# Patient Record
Sex: Male | Born: 1935 | Race: White | Hispanic: No | Marital: Married | Smoking: Never smoker
Health system: Southern US, Community
[De-identification: ages and names within clinical notes are randomized; demographics above are authoritative.]

## PROBLEM LIST (undated history)

## (undated) DIAGNOSIS — I10 Essential (primary) hypertension: Secondary | ICD-10-CM

## (undated) DIAGNOSIS — R430 Anosmia: Secondary | ICD-10-CM

## (undated) DIAGNOSIS — N189 Chronic kidney disease, unspecified: Secondary | ICD-10-CM

## (undated) DIAGNOSIS — N2889 Other specified disorders of kidney and ureter: Secondary | ICD-10-CM

## (undated) DIAGNOSIS — H3530 Unspecified macular degeneration: Secondary | ICD-10-CM

## (undated) DIAGNOSIS — R809 Proteinuria, unspecified: Secondary | ICD-10-CM

## (undated) DIAGNOSIS — L309 Dermatitis, unspecified: Secondary | ICD-10-CM

## (undated) DIAGNOSIS — H9190 Unspecified hearing loss, unspecified ear: Secondary | ICD-10-CM

## (undated) DIAGNOSIS — J449 Chronic obstructive pulmonary disease, unspecified: Secondary | ICD-10-CM

## (undated) HISTORY — PX: MIDDLE EAR SURGERY: SHX713

## (undated) HISTORY — PX: HERNIA REPAIR: SHX51

---

## 2004-12-11 ENCOUNTER — Inpatient Hospital Stay (HOSPITAL_COMMUNITY): Admission: EM | Admit: 2004-12-11 | Discharge: 2004-12-12 | Payer: Self-pay | Admitting: Emergency Medicine

## 2005-02-26 ENCOUNTER — Ambulatory Visit (HOSPITAL_COMMUNITY): Admission: RE | Admit: 2005-02-26 | Discharge: 2005-02-27 | Payer: Self-pay | Admitting: Otolaryngology

## 2005-02-26 ENCOUNTER — Encounter (INDEPENDENT_AMBULATORY_CARE_PROVIDER_SITE_OTHER): Payer: Self-pay | Admitting: Specialist

## 2005-10-02 ENCOUNTER — Ambulatory Visit (HOSPITAL_BASED_OUTPATIENT_CLINIC_OR_DEPARTMENT_OTHER): Admission: RE | Admit: 2005-10-02 | Discharge: 2005-10-02 | Payer: Self-pay | Admitting: Otolaryngology

## 2005-10-02 ENCOUNTER — Ambulatory Visit (HOSPITAL_COMMUNITY): Admission: RE | Admit: 2005-10-02 | Discharge: 2005-10-02 | Payer: Self-pay | Admitting: Otolaryngology

## 2005-10-02 ENCOUNTER — Encounter (INDEPENDENT_AMBULATORY_CARE_PROVIDER_SITE_OTHER): Payer: Self-pay | Admitting: Specialist

## 2007-12-09 ENCOUNTER — Inpatient Hospital Stay (HOSPITAL_COMMUNITY): Admission: EM | Admit: 2007-12-09 | Discharge: 2007-12-12 | Payer: Self-pay | Admitting: Emergency Medicine

## 2010-06-13 ENCOUNTER — Emergency Department (HOSPITAL_COMMUNITY): Admission: EM | Admit: 2010-06-13 | Discharge: 2010-06-13 | Payer: Self-pay | Admitting: Emergency Medicine

## 2011-05-13 NOTE — Discharge Summary (Signed)
NAMEALDEN, BENSINGER NO.:  [id number]   MEDICAL RECORD NO.:  [id number]          PATIENT TYPE:  INP   LOCATION:  4707                         FACILITY:  [facility]   PHYSICIAN:  Lonia Blood, M.D.DATE OF BIRTH:  12-06-1920   DATE OF ADMISSION:  12/09/2007  DATE OF DISCHARGE:  12/12/2007                               DISCHARGE SUMMARY   PRIMARY CARE PHYSICIAN:  Gloriajean Dell. Andrey Campanile, M.D. at [facility].   DISCHARGE DIAGNOSES:  1. Pyelonephritis/prostatitis.  2. Hypotension, dehydration, and mild bacteremia/sepsis.  3. Symptoms of benign prostatic hypertrophy.  4. Acute renal failure.      a.     Peak creatinine 2.9.      b.     Creatinine 1.8 at discharge.      c.     A baseline creatinine 1.4, October 2006.  5. Hypokalemia--diuretic associated, resolved.  6. Elevated PSA, but in the setting of probable prostatitis   DISCHARGE MEDICATIONS:  1. Ciprofloxacin 500 mg p.o. b.i.d. x14 days, then stop.  2. Cardura 1 mg p.o. daily.   FOLLOWUP:  The patient is advised to followup with Dr. Benedetto Goad in 7-  10 days.  At time a BMET should be obtained to assure the patient's  renal function is completely normalized.  The patient should also be  assessed for followup of his BPH symptoms.  Consideration should also be  given to rechecking a PSA, at such time as the patient's pyelonephritis  has completely resolved, to determine if further evaluation is  necessary.   CONSULTATIONS:  None.   PROCEDURES:  1. CT scan of the head -- no acute intracranial findings.  2. Renal ultrasound -- normal kidneys with no evidence of      hydronephrosis.   HOSPITAL COURSE:  Mr. Trashawn Oquendo is a very pleasant 77 year old  gentleman.  He was admitted to the hospital on December 09, 2007, after  he presented with complaints of lethargy and somnolence.  Evaluation in  the ER had included a CT scan of the head which was unrevealing for  acute disease.  Full evaluation  revealed that the patient was suffering  with a significant pyelonephritis.  Clinical symptoms suggested, that  this was also likely a prostatitis.  Empiric antibiotics were initiated.  The patient tolerated these antibiotics without difficulty.  Concomitant  with his pyelonephritis, the patient was also noted to be suffering with  a clinical low-grade sepsis.  This was marked by dehydration and  tachycardia.  The patient was aggressively volume resuscitated and  responded nicely.  The patient's hypotension resolved with appropriate  volume resuscitation.  As a sequelae; however, the patient was also  noted to be suffering with acute renal failure.  Creatinine reached a  peak of 2.9 during his hospital stay.  With volume resuscitation, and  holding of the patient's diuretics; his renal function improve to a  creatinine of 1.8 at the time of his discharge.  Follow up BMET is  recommended in the outpatient setting.  The patient has been advised to  discontinue his diuretic and  his potassium therapy until outpatient  follow up is accomplished to determine if this is, in fact, needed.   With questioning during his hospital stay the patient did describe  significant urinary hesitancy.  He did not describe symptoms of  inability to void completely, however.  Cardura was initiated during his  hospital stay.  After discontinuation of the patient's Foley, he had no  difficulty urinating at all.  Due to his symptoms a PSA was obtained.  This was found to be elevated at 4.6.  This is, however, in the setting  of a probable prostatitis; and this likely, alone, explains elevation of  his PSA.  Follow up PSA is recommended at such time until the patient  has completed his full antibiotic therapy, and symptoms of his urinary  tract infection have resolved.  If it remains elevated, consultation  with urology would be appropriate.   On December 12, 2007, the patient was deemed to be stable for  discharge  home.  Vital signs were stable, and he was afebrile.  He was urinating  without difficulty, status post removal of his Foley catheter x24 hours.  Potassium, which he had previously been mildly diminished, had  normalized at 3.7; and creatinine had reached its inpatient nadir of  1.8.      Lonia Blood, M.D.  Electronically Signed     JTM/MEDQ  D:  12/12/2007  T:  12/13/2007  Job:  [id number]   cc:   Gloriajean Dell. Andrey Campanile, M.D.

## 2011-05-13 NOTE — H&P (Signed)
NAMECASSADY, Brett Perry                 ACCOUNT NO.:  [id number]   MEDICAL RECORD NO.:  [id number]          PATIENT TYPE:  EMS   LOCATION:  MAJO                         FACILITY:  [facility]   PHYSICIAN:  Wilson Singer, M.D.DATE OF BIRTH:  1920/08/19   DATE OF ADMISSION:  12/09/2007  DATE OF DISCHARGE:                              HISTORY & PHYSICAL   PRIMARY CARE PHYSICIAN:  Gloriajean Dell. Andrey Campanile, M.D.   HISTORY:  This is a very pleasant 77 year old gentleman who yesterday  evening started to get headaches for which he took Advil.  The headaches  did not improve earlier this morning and he took further Advil.  Also,  one of the sons who is with him tells me that he has been drowsy and  slightly confused over the last week or so.  He described dysuria which  started in the last 12 hours also.  Because of the symptoms of headache,  which were not improving, he came to the emergency room.   PAST MEDICAL HISTORY:  Significant for:  1. Hypertension.  2. History of periventricular bleed from multiple cavernous angiomas      in December 2005.   PAST SURGICAL HISTORY:  Excision of right ear due to squamous cell  carcinoma in March 2006, and skin grafting in October 2006, by Dr.  Jearld Fenton.   SOCIAL HISTORY:  He has been married for 51 years.  He lives with his  wife.  He does not smoke and does not drink alcohol.   MEDICATIONS:  1. HCTZ 25 mg daily.  2. Potassium chloride 20 mEq daily.   ALLERGIES:  None.   FAMILY HISTORY:  Noncontributory.   REVIEW OF SYSTEMS:  Apart from the symptoms mentioned above, there are  no other symptoms such as nausea, vomiting, abdominal pain, or other  symptoms in all other systems reviewed.   PHYSICAL EXAMINATION:  VITAL SIGNS:  Temperature 97.1, blood pressure  reduced at 91/53, pulse 80, respiratory rate 12-14, saturation 96%.  GENERAL:  He actually looks clinically well but somewhat dehydrated.  He  does not look clinically septic/toxic.  CARDIOVASCULAR:   Heart sounds are present and normal.  RESPIRATORY:  Lung fields are clear.  ABDOMEN:  Soft, nontender with no hepatosplenomegaly.  NEUROLOGIC:  He is alert and oriented with no focal neurologic signs.   INVESTIGATIONS:  Sodium 141, potassium 3, bicarbonate 25, glucose 111,  BUN 25, creatinine 2.68.  Urinalysis is suggestive of a urinary tract  infection.  Hemoglobin 13.7, white blood cell count 19, platelets 164.   IMPRESSION:  1. Urinary tract infection, plus possible pyelonephritis.  2. Possible systemic inflammatory response of sepsis, although not      obviously toxic.  3. History of hypertension.  4. Acute renal failure and hypovolemia.  His baseline creatinine was      1.4 in October 2006.   PLAN:  1. Admit.  2. Intravenous fluids.  3. Intravenous antibiotics.  4. Renal ultrasound to rule out hydronephrosis.  5. We will place a Foley catheter to make sure he does not have any  urethral outflow obstruction.   Further recommendations will depend on the patient's hospital progress.      Wilson Singer, M.D.  Electronically Signed     NCG/MEDQ  D:  12/09/2007  T:  12/09/2007  Job:  [id number]   cc:   Gloriajean Dell. Andrey Campanile, M.D.

## 2011-05-16 NOTE — Op Note (Signed)
NAMEJAYION, Brett Perry NO.:  [id number]   MEDICAL RECORD NO.:  [id number]          PATIENT TYPE:  OIB   LOCATION:  2859                         FACILITY:  [facility]   PHYSICIAN:  Suzanna Obey, M.D.       DATE OF BIRTH:  [birth year]   DATE OF PROCEDURE:  02/26/2005  DATE OF DISCHARGE:                                 OPERATIVE REPORT   PREOPERATIVE DIAGNOSIS:  Right ear lesion.   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   PROCEDURE:  Excision of right ear with partial resection of the entire ear  down to the mastoid bone with full-thickness cartilage and a full-thickness  skin graft reconstruction.   SURGEON:  Suzanna Obey, M.D.   ANESTHESIA:  General endotracheal tube.   ESTIMATED BLOOD LOSS:  Approximately 10 mL.   INDICATIONS:  This is an 77 year old who has had an ulceration of his right  ear that has been worsening.  It has been somewhat painful.  He now needs  resection and most likely this is a cancer of the ear.  A frozen section  will be done at the time of the operation, since it is quite tender to him.  The patient was informed of the risks and benefits of the procedure  including bleeding, infection, scarring, deformity of his ear, numbness,  scar of the donor site, and risks of the anesthetic.  All questions were  answered and a consent was obtained.   OPERATION:  The patient was taken to the operating room and placed in a  supine position.  After adequate general endotracheal tube anesthesia, he  was placed in a left gaze position and a frozen section was immediately  sent, which was squamous cell carcinoma.  A wide excision was made around  this lesion, which was very ulcerative and in the base of the concha.  The  excision was carried circumferentially around it; it went all the way into  the ear canal.  This was excised with electrocautery through the cartilage  and then the cartilage was peeled off, where, in a very  central portion of  this that was encountered, it appeared the tumor had extended through the  cartilage.  The dissection was carried deep down to the mastoid periosteum.  The periosteum was left intact.  This was sent for a permanent section;  frozen sections were sent of the deep margin, anterior margin, superior  margin and lateral margin, all of which were negative for any tumor.  The  inferior margin was very wide and had a large distance of almost a  centimeter between the tumor and the margin.  This was then cleaned up with  irrigation and a full-thickness skin graft was harvested from the  subclavicular region.  This was positioned into the wound and secured with 4-  0 chromic.  A bolster was then positioned with Xeroform gauze into the wound  and cotton and stay sutures were used  with 3-0 silk to secure the bolster.  This fit in there very nicely.  Drain  holes were  placed through the skin graft.  The donor site was closed with  interrupted 3-0 nylon.  The patient was then awakened and brought to  recovery in stable condition.  Counts correct.      JB/MEDQ  D:  02/26/2005  T:  02/26/2005  Job:  [id number]   cc:   Gloriajean Dell. Andrey Campanile, M.D.  [address]  [place]  [place] [address]  Fax: [phone]

## 2011-05-16 NOTE — H&P (Signed)
Brett Perry, Brett Perry NO.:  [id number]   MEDICAL RECORD NO.:  [id number]          PATIENT TYPE:  EMS   LOCATION:  MAJO                         FACILITY:  [facility]   PHYSICIAN:  Marlan Palau, M.D.  DATE OF BIRTH:  10/12/1920   DATE OF ADMISSION:  12/11/2004  DATE OF DISCHARGE:                                HISTORY & PHYSICAL   HISTORY OF PRESENT ILLNESS:  Brett Perry is an 77 year old left-handed  white male born 01-22-1920, with a history of hypertension. This patient  comes to the [facility] Emergency Room today for evaluation of a severe  headache that began around 9 a.m. today, peaked out in 10 to 15 minutes with  bifrontal headache predominantly, no neck stiffness, had some nausea, no  vomiting.  The patient denies any numbness or weakness on the face, arms, or  legs.  Denies any visual field changes, denies any bowel problems.  The  patient does not have a history of similar type headaches in the past.  The  patient came to the emergency room for evaluation.  A CT scan of the brain  was done showing evidence of small periventricular hemorrhage on the left.  MRI scan as well as CT scan confirmed multiple cavernous angiomas in the  cerebellum, pons, deep white matter.  The area of hemorrhage appears to be  around one of these cavernous angiomas.  Hemorrhage is fairly small.  No  significant mass effect is noted.  Some minimal edema around the hemorrhage  is noted.  The patient had been on aspirin prior to this evaluation.  Neurology was called for an evaluation.  With treatment in the emergency  room, the headache is significantly improved.   PAST MEDICAL HISTORY:  1.  History of multiple cavernous angiomas with small intracranial      hemorrhage as above.  2.  Hypertension.  3.  Hernia surgery in 1986.  4.  Rosacea.  5.  History of squamous cell carcinoma of the skin.  6.  History of cataracts.   MEDICATIONS:  1.  Aspirin 1 a day.  2.   Hydrochlorothiazide 25 mg a day.  3.  Klor 20 mEq a day.  4.  Multivitamins.   ALLERGIES:  No known allergies.   HABITS:  The patient does not smoke or drink.   SOCIAL HISTORY:  [place],  is married, has four children who are alive and well.  The patient is  retired.  The patient had been very active prior to this admission.   FAMILY MEDICAL HISTORY:  Notable for fact that mother died of old age and  heart disease.  Father died with cancer.  The patient has four brothers and  one sister.  One brother, however, has passed away with cirrhosis of the  liver from alcohol abuse.  The rest of the siblings are in good health.   REVIEW OF SYSTEMS:  Notable for no fevers or chills prior to this admission.  The patient denies any vision changes, swallowing problems, speech changes,  neck stiffness.  Denies shortness of breath, chest pain.  Did have some  nausea, no vomiting.  Denies any problems controlling the bowels or bladder,  has not had blackout episodes or seizures.   PHYSICAL EXAMINATION:  VITAL SIGNS:  Blood pressure initially 155/82, heart  rate 53, respiratory rate 22, temperature afebrile.  GENERAL:  This patient is a fairly well developed, elderly white male who is  alert and cooperative at the time of examination.  HEENT:  Head is atraumatic.  Eyes:  Pupils equal, round, and reactive to  light. Cataracts noted bilaterally, more dense on the right than the left.  Disk is not well visualized on the right, seems to be flat on the left.  NECK:  Supple.  No carotid bruits noted.  RESPIRATORY:  Examination is clear.  CARDIOVASCULAR:  Regular rate and rhythm.  No obvious murmurs or rubs noted.  EXTREMITIES:  Without significant edema.  ABDOMEN:  Positive bowel sounds.  No organomegaly or tenderness noted.  NEUROLOGIC:  Cranial nerves as above.  Facial symmetry is present.  The  patient has good sensation of the face to pinprick and  soft touch  bilaterally.  Has good strength to facial muscles and the muscles to head  turning and shoulder shrug bilaterally.  Speech is well enunciated and not  aphasic.  Motor testing reveals 5/5 strength in all fours. Good symmetric  motor tone is noted throughout.  Sensory testing is intact to pinprick, soft  touch, and vibratory sensation throughout.  Position sensory was not tested.  The patient has good finger-nose-finger and toe-to-finger bilaterally. Gait  was not tested.  deep tendon reflexes were depressed but symmetric  throughout.  Toes were neutral bilaterally.  No drift is seen.   LABORATORY DATA AND OTHER STUDIES:  Laboratory values notable for white  count 7.8, hemoglobin 15.8, hematocrit 44.4, MCV 81.7, platelets 234.  Sodium 140, potassium 4.3, chloride 105, BUN 17, glucose 115.  The pH was  7.369, PCO2 of 48.8.  INR 1.2.   CT of the head is as above.   EKG is pending.   IMPRESSION:  1.  History of multiple cavernous angiomas with small periventricular      hemorrhage.  2.  Hypertension.   This patient does use aspirin at this time.  The patient has no focal  neurologic deficits.  At this point, will admit this patient for observation  overnight.  If patient remains stable and no evidence of extension of  hemorrhage is noted by repeat CT scan in a.m., will allow patient to be  discharged to home.  The patient is doing fairly well at this point with his  headache.   PLAN:  1.  Admission to [facility].  2.  CT scan of the head in a.m.  3.  Analgesic treatment for headache.  4.  Discontinue aspirin.  5.  Will follow patient closely while in house.       CKW/MEDQ  D:  12/11/2004  T:  12/11/2004  Job:  [id number]   cc:   Gloriajean Dell. Andrey Campanile, M.D.  [address]  [place]  [place] [address]  Fax: [phone]   [facility]  [address], [address]

## 2011-05-16 NOTE — Op Note (Signed)
Brett Perry, Brett Perry                 ACCOUNT NO.:  [id number]   MEDICAL RECORD NO.:  [id number]          PATIENT TYPE:  AMB   LOCATION:  DSC                          FACILITY:  [facility]   PHYSICIAN:  Suzanna Obey, M.D.       DATE OF BIRTH:  05-Apr-1920   DATE OF PROCEDURE:  10/02/2005  DATE OF DISCHARGE:                                 OPERATIVE REPORT   PREOPERATIVE DIAGNOSIS:  Right squamous cell carcinoma of the ear.   POSTOPERATIVE DIAGNOSIS:  Right squamous cell carcinoma of the ear.   PROCEDURE:  Excision of right skin cancer with full-thickness skin graft and  reconstruction.   ANESTHESIA:  General.   ESTIMATED BLOOD LOSS:  Less than 10 cc.   INDICATIONS FOR PROCEDURE:  This is an 77 year old who has previously had a  very large squamous cell carcinoma of the concha which now looks great.  He  has a new lesion that is out near the scaphoid fossa and helix which has  been biopsied-proven squamous cell carcinoma.  He was informed of the risks  and benefits of the procedure, including bleeding, infection, scarring,  recurrence, deformity of the ear, and risks of the anesthetic.  All  questions were answered, and consent was obtained.   DESCRIPTION OF PROCEDURE:  The patient was taken to the operating room and  placed in the supine position after adequate general endotracheal tube  anesthesia.   We excised around this lesion with an adequate margin with a 15 blade.  The  cartilage was transected and then dissected off of the posterior skin.  This  allowed a full-thickness excision with the cartilage included.  The medial  margin was marked and sent for permanent.  The wound looked good.  The  cartilage was trimmed, and then a graft was harvested from the postauricular  area.  Full-thickness graft was harvested and placed into the wound and  secured with a 4-0 chromic.  The stay sutures were then placed with 3-0  nylon, and a Xeroform gauze and cotton bolster were placed over the  graft.  The postauricular incision was undermined and then closed with interrupted 3-  0 Vicryl and running 5-0 nylon.   The patient was then awakened and brought to recovery in stable condition.  Counts were correct.           ______________________________  Suzanna Obey, M.D.     JB/MEDQ  D:  10/02/2005  T:  10/02/2005  Job:  [id number]

## 2011-10-06 LAB — BASIC METABOLIC PANEL
BUN: 25 — ABNORMAL HIGH
BUN: 28 — ABNORMAL HIGH
BUN: 33 — ABNORMAL HIGH
CO2: 20
CO2: 21
CO2: 25
Calcium: 8 — ABNORMAL LOW
Calcium: 8 — ABNORMAL LOW
Calcium: 8.2 — ABNORMAL LOW
Chloride: 106
Chloride: 111
Creatinine, Ser: 1.81 — ABNORMAL HIGH
Creatinine, Ser: 2.32 — ABNORMAL HIGH
Creatinine, Ser: 2.68 — ABNORMAL HIGH
GFR calc Af Amer: 28 — ABNORMAL LOW
GFR calc Af Amer: 33 — ABNORMAL LOW
GFR calc non Af Amer: 23 — ABNORMAL LOW
GFR calc non Af Amer: 27 — ABNORMAL LOW
Glucose, Bld: 111 — ABNORMAL HIGH
Glucose, Bld: 121 — ABNORMAL HIGH
Glucose, Bld: 98
Potassium: 3 — ABNORMAL LOW
Potassium: 3.2 — ABNORMAL LOW
Sodium: 137
Sodium: 138
Sodium: 141

## 2011-10-06 LAB — CBC
HCT: 34.8 — ABNORMAL LOW
HCT: 36.4 — ABNORMAL LOW
HCT: 39.7
Hemoglobin: 12 — ABNORMAL LOW
Hemoglobin: 12.3 — ABNORMAL LOW
Hemoglobin: 12.4 — ABNORMAL LOW
Hemoglobin: 13.7
MCHC: 34
MCHC: 34.3
MCHC: 34.4
MCHC: 34.4
MCV: 84.7
MCV: 84.8
MCV: 85.3
Platelets: 107 — ABNORMAL LOW
Platelets: 116 — ABNORMAL LOW
Platelets: 164
Platelets: 95 — ABNORMAL LOW
RBC: 4.11 — ABNORMAL LOW
RBC: 4.26
RBC: 4.68
RDW: 13
RDW: 13.9
RDW: 14
RDW: 14.1
WBC: 11.7 — ABNORMAL HIGH
WBC: 19 — ABNORMAL HIGH
WBC: 4.4

## 2011-10-06 LAB — FREE PSA
PSA, Free Pct: 20 — ABNORMAL LOW (ref >25)
PSA, Free: 0.9

## 2011-10-06 LAB — URINE CULTURE
Colony Count: 5000
Special Requests: NEGATIVE

## 2011-10-06 LAB — URINALYSIS, ROUTINE W REFLEX MICROSCOPIC
Glucose, UA: NEGATIVE
Glucose, UA: NEGATIVE
Ketones, ur: 15 — AB
Ketones, ur: 15 — AB
Nitrite: POSITIVE — AB
Nitrite: POSITIVE — AB
Protein, ur: >300 — AB
Protein, ur: >300 — AB
Specific Gravity, Urine: 1.021
Specific Gravity, Urine: 1.026
Urobilinogen, UA: 0.2
Urobilinogen, UA: 1
pH: 5.5
pH: 5.5

## 2011-10-06 LAB — PSA: PSA: 4.6 — ABNORMAL HIGH

## 2011-10-06 LAB — URINE MICROSCOPIC-ADD ON

## 2011-10-06 LAB — POCT CARDIAC MARKERS
CKMB, poc: <1 — ABNORMAL LOW
Myoglobin, poc: 269
Operator id: 198171
Troponin i, poc: <0.05

## 2011-10-06 LAB — DIFFERENTIAL
Basophils Absolute: 0.2 — ABNORMAL HIGH
Basophils Relative: 1
Eosinophils Absolute: 0 — ABNORMAL LOW
Eosinophils Relative: 0
Lymphocytes Relative: 3 — ABNORMAL LOW
Lymphs Abs: 0.6 — ABNORMAL LOW
Monocytes Absolute: 1.3 — ABNORMAL HIGH
Monocytes Relative: 7
Neutro Abs: 16.9 — ABNORMAL HIGH
Neutrophils Relative %: 89 — ABNORMAL HIGH
WBC Morphology: INCREASED

## 2011-10-06 LAB — COMPREHENSIVE METABOLIC PANEL
ALT: 12
AST: 16
Albumin: 2.7 — ABNORMAL LOW
Alkaline Phosphatase: 84
BUN: 37 — ABNORMAL HIGH
CO2: 24
Calcium: 8 — ABNORMAL LOW
Chloride: 106
Creatinine, Ser: 2.9 — ABNORMAL HIGH
GFR calc Af Amer: 25 — ABNORMAL LOW
GFR calc non Af Amer: 21 — ABNORMAL LOW
Glucose, Bld: 101 — ABNORMAL HIGH
Potassium: 3 — ABNORMAL LOW
Sodium: 140
Total Bilirubin: 2.4 — ABNORMAL HIGH
Total Protein: 5.9 — ABNORMAL LOW

## 2011-10-06 LAB — CULTURE, BLOOD (ROUTINE X 2)
Culture: NO GROWTH
Culture: NO GROWTH

## 2013-01-17 ENCOUNTER — Other Ambulatory Visit: Payer: Self-pay | Admitting: Family Medicine

## 2013-01-17 DIAGNOSIS — N189 Chronic kidney disease, unspecified: Secondary | ICD-10-CM

## 2013-01-20 ENCOUNTER — Ambulatory Visit
Admission: RE | Admit: 2013-01-20 | Discharge: 2013-01-20 | Disposition: A | Discharge status: Home / Self Care | Payer: Medicare Other | Source: Ambulatory Visit | Attending: Family Medicine | Admitting: Family Medicine

## 2013-01-20 DIAGNOSIS — N189 Chronic kidney disease, unspecified: Secondary | ICD-10-CM

## 2013-08-17 ENCOUNTER — Other Ambulatory Visit: Payer: Self-pay | Admitting: Urology

## 2013-08-17 DIAGNOSIS — N2889 Other specified disorders of kidney and ureter: Secondary | ICD-10-CM

## 2013-08-24 ENCOUNTER — Ambulatory Visit
Admission: RE | Admit: 2013-08-24 | Discharge: 2013-08-24 | Disposition: A | Discharge status: Home / Self Care | Payer: Medicare Other | Source: Ambulatory Visit | Attending: Urology | Admitting: Urology

## 2013-08-24 DIAGNOSIS — N2889 Other specified disorders of kidney and ureter: Secondary | ICD-10-CM

## 2013-08-24 HISTORY — DX: Proteinuria, unspecified: R80.9

## 2013-08-24 HISTORY — DX: Other specified disorders of kidney and ureter: N28.89

## 2013-08-24 HISTORY — DX: Essential (primary) hypertension: I10

## 2013-08-24 HISTORY — DX: Dermatitis, unspecified: L30.9

## 2013-08-24 HISTORY — DX: Chronic obstructive pulmonary disease, unspecified: J44.9

## 2013-08-24 HISTORY — DX: Unspecified hearing loss, unspecified ear: H91.90

## 2013-08-24 HISTORY — DX: Anosmia: R43.0

## 2013-12-20 ENCOUNTER — Other Ambulatory Visit (HOSPITAL_COMMUNITY): Payer: Self-pay | Admitting: Interventional Radiology

## 2013-12-20 DIAGNOSIS — N289 Disorder of kidney and ureter, unspecified: Secondary | ICD-10-CM

## 2014-01-02 ENCOUNTER — Telehealth: Payer: Self-pay | Admitting: Emergency Medicine

## 2014-01-02 NOTE — Telephone Encounter (Signed)
S/W WIFE AND TOLD HER THAT WE ARE OK W/ F/U IN .  SINCE CT WAS STABLE AND THAT IS WHEN DR HALL WILL F/U W/ PT ALSO.

## 2014-01-02 NOTE — Telephone Encounter (Signed)
CALLED AND S[facility]W W[facility]-  WIFE STATED THAT THE CT WAS STABLE AND THAT [place] DOES NOT HAVE TO F/U W/ DR HALL FOR ANOTHER 6MOS. [facility].

## 2014-06-21 ENCOUNTER — Other Ambulatory Visit (HOSPITAL_COMMUNITY): Payer: Self-pay | Admitting: Interventional Radiology

## 2014-06-21 DIAGNOSIS — N289 Disorder of kidney and ureter, unspecified: Secondary | ICD-10-CM

## 2014-06-26 ENCOUNTER — Ambulatory Visit (HOSPITAL_COMMUNITY)
Admission: RE | Admit: 2014-06-26 | Discharge: 2014-06-26 | Disposition: A | Discharge status: Home / Self Care | Payer: Medicare HMO | Source: Ambulatory Visit | Attending: Interventional Radiology | Admitting: Interventional Radiology

## 2014-06-26 ENCOUNTER — Encounter (HOSPITAL_COMMUNITY): Payer: Self-pay

## 2014-06-26 DIAGNOSIS — I714 Abdominal aortic aneurysm, without rupture, unspecified: Secondary | ICD-10-CM | POA: Insufficient documentation

## 2014-06-26 DIAGNOSIS — N289 Disorder of kidney and ureter, unspecified: Secondary | ICD-10-CM

## 2014-07-06 ENCOUNTER — Ambulatory Visit
Admission: RE | Admit: 2014-07-06 | Discharge: 2014-07-06 | Disposition: A | Discharge status: Home / Self Care | Payer: Commercial Managed Care - HMO | Source: Ambulatory Visit | Attending: Interventional Radiology | Admitting: Interventional Radiology

## 2014-07-06 VITALS — BP 111/61 | HR 55 | Temp 98.2°F | Resp 15 | Ht 68.0 in | Wt 150.0 lb

## 2014-07-06 DIAGNOSIS — N289 Disorder of kidney and ureter, unspecified: Secondary | ICD-10-CM

## 2014-07-06 NOTE — Progress Notes (Signed)
Denies hematuria or any other problems with urination.    Appetite fair.  Has lost 8 lbs since last year.  Camdynn Maranto Carmell AustriaGales, RN 07/06/2014 8:40 AM

## 2014-11-17 ENCOUNTER — Encounter (INDEPENDENT_AMBULATORY_CARE_PROVIDER_SITE_OTHER): Payer: Medicare HMO | Admitting: Ophthalmology

## 2014-11-17 DIAGNOSIS — H3532 Exudative age-related macular degeneration: Secondary | ICD-10-CM

## 2014-11-17 DIAGNOSIS — H43813 Vitreous degeneration, bilateral: Secondary | ICD-10-CM

## 2014-11-17 DIAGNOSIS — H3531 Nonexudative age-related macular degeneration: Secondary | ICD-10-CM

## 2014-12-14 ENCOUNTER — Other Ambulatory Visit (HOSPITAL_COMMUNITY): Payer: Self-pay | Admitting: Interventional Radiology

## 2014-12-14 DIAGNOSIS — N2889 Other specified disorders of kidney and ureter: Secondary | ICD-10-CM

## 2014-12-15 ENCOUNTER — Encounter (INDEPENDENT_AMBULATORY_CARE_PROVIDER_SITE_OTHER): Payer: Medicare HMO | Admitting: Ophthalmology

## 2014-12-15 DIAGNOSIS — H3532 Exudative age-related macular degeneration: Secondary | ICD-10-CM

## 2014-12-15 DIAGNOSIS — H3531 Nonexudative age-related macular degeneration: Secondary | ICD-10-CM

## 2014-12-15 DIAGNOSIS — H43813 Vitreous degeneration, bilateral: Secondary | ICD-10-CM

## 2015-01-12 ENCOUNTER — Encounter (INDEPENDENT_AMBULATORY_CARE_PROVIDER_SITE_OTHER): Payer: Medicare HMO | Admitting: Ophthalmology

## 2015-01-12 DIAGNOSIS — H3532 Exudative age-related macular degeneration: Secondary | ICD-10-CM

## 2015-01-12 DIAGNOSIS — H43813 Vitreous degeneration, bilateral: Secondary | ICD-10-CM

## 2015-01-12 DIAGNOSIS — H3531 Nonexudative age-related macular degeneration: Secondary | ICD-10-CM

## 2015-01-15 ENCOUNTER — Ambulatory Visit (HOSPITAL_COMMUNITY)
Admission: RE | Admit: 2015-01-15 | Discharge: 2015-01-15 | Disposition: A | Discharge status: Home / Self Care | Payer: Medicare HMO | Source: Ambulatory Visit | Attending: Interventional Radiology | Admitting: Interventional Radiology

## 2015-01-15 ENCOUNTER — Encounter (HOSPITAL_COMMUNITY): Payer: Self-pay

## 2015-01-15 DIAGNOSIS — N2889 Other specified disorders of kidney and ureter: Secondary | ICD-10-CM | POA: Diagnosis present

## 2015-01-25 ENCOUNTER — Inpatient Hospital Stay
Admission: RE | Admit: 2015-01-25 | Discharge: 2015-01-25 | Disposition: A | Discharge status: Home / Self Care | Payer: Commercial Managed Care - HMO | Source: Ambulatory Visit | Attending: Interventional Radiology | Admitting: Interventional Radiology

## 2015-02-08 ENCOUNTER — Ambulatory Visit
Admission: RE | Admit: 2015-02-08 | Discharge: 2015-02-08 | Disposition: A | Discharge status: Home / Self Care | Payer: Commercial Managed Care - HMO | Source: Ambulatory Visit | Attending: Interventional Radiology | Admitting: Interventional Radiology

## 2015-02-08 DIAGNOSIS — N2889 Other specified disorders of kidney and ureter: Secondary | ICD-10-CM

## 2015-02-08 HISTORY — PX: IR GENERIC HISTORICAL: IMG1180011

## 2015-02-08 NOTE — Progress Notes (Signed)
Patient ID: Brett Perry, male   DOB: 1920-11-16, 81 y.o.   MRN: [id number]   Chief Complaint: Chief Complaint  Patient presents with  . Follow-up    Surveillance of Right Renal Mass    Referring Physician(s): Marcha SoldersMarshall Hall, MD  History of Present Illness: Brett Perry is a 81 y.o. male who returns for follow-up of an exophytic solid right renal mass. Patient remains asymptomatic. No abdominal pain, flank pain, hematuria, or new urinary tract symptoms. Most recent CT without contrast performed 01/15/2015 was reviewed. Measurements were repeated of the exophytic right renal mass. Lesion now measures 4.6 x 3.6 cm, previously 4.3 x 3.6 m. Very minimal increase in size. No evidence of acute hemorrhage or new finding. No hydronephrosis.  Past Medical History  Diagnosis Date  . Right kidney mass   . Chronic renal insufficiency   . Hypertension     Benign Essential Hypertension  . COPD (chronic obstructive pulmonary disease)   . Dermatitis   . Hearing loss   . Proteinuria   . Anosmia     No past surgical history on file.  Allergies: Review of patient's allergies indicates no known allergies.  Medications: Prior to Admission medications   Not on File    No family history on file.  History   Social History  . Marital Status: Married    Spouse Name: N/A  . Number of Children: N/A  . Years of Education: N/A   Social History Main Topics  . Smoking status: Never Smoker   . Smokeless tobacco: Never Used  . Alcohol Use: No  . Drug Use: No  . Sexual Activity: Not on file   Other Topics Concern  . Not on file   Social History Narrative    Review of Systems: A 12 point ROS discussed and pertinent positives are indicated in the HPI above.  All other systems are negative.  Review of Systems  Vital Signs: BP 139/63 mmHg  Pulse 56  Temp(Src) 97.5 F (36.4 C) (Oral)  Resp 14  Ht 5\' 10"  (1.778 m)  Wt 142 lb (64.411 kg)  BMI 20.37 kg/m2  SpO2 100%  Physical Exam    Constitutional: He is oriented to person, place, and time. He appears well-developed and well-nourished. No distress.  Abdominal: Soft. Bowel sounds are normal. He exhibits no distension.  No organomegaly, abdominal pain or flank pain. No CVA tenderness.  Neurological: He is alert and oriented to person, place, and time.  Skin: He is not diaphoretic.  Psychiatric: He has a normal mood and affect. His behavior is normal.    Imaging: Ct Abdomen Wo Contrast  01/15/2015   CLINICAL DATA:  Followup surveillance of right renal mass.  EXAM: CT ABDOMEN WITHOUT CONTRAST  TECHNIQUE: Multidetector CT imaging of the abdomen was performed following the standard protocol without IV contrast.  COMPARISON:  None.  FINDINGS: Renal: Rounded exophytic right renal mass is again demonstrated measuring 3.6 x 4.6 cm (image 30, series 2) increased from 3.3 x 4.3 cm. This lesion is isodense to renal parenchyma. A fluid density exophytic lesion from the left kidney measures 11 mm not changed.  Lung bases are clear. No focal hepatic lesion. The gallbladder, pancreas, spleen, and adrenal glands are normal.  The stomach, small bowel limited view of the colon are unremarkable.  No retroperitoneal periportal lymphadenopathy. Abdominal aorta is calcified. No aggressive osseous lesion.  IMPRESSION: Mild interval increase in size of indeterminate right renal mass.   Electronically Signed   By:  Genevive Bi M.D.   On: 01/15/2015 15:10     Assessment and Plan:  81 year old male with a stable to minimally increased exophytic solid right renal mass. Patient remains asymptomatic. No evidence of significant progression or development of metastatic disease. After our discussion today, the patient and his wife are comfortable with continued surveillance every 6 months given his age and comorbidities. He is also closely followed by his urologist who is in agreement with this plan.   SignedBerdine Dance 02/08/2015, 4:36 PM   I  spent a total of 20 minutes face to face in clinical consultation, greater than 50% of which was counseling/coordinating care for the patient.

## 2015-02-09 ENCOUNTER — Encounter (INDEPENDENT_AMBULATORY_CARE_PROVIDER_SITE_OTHER): Payer: Medicare HMO | Admitting: Ophthalmology

## 2015-02-09 DIAGNOSIS — H43813 Vitreous degeneration, bilateral: Secondary | ICD-10-CM

## 2015-02-09 DIAGNOSIS — H3532 Exudative age-related macular degeneration: Secondary | ICD-10-CM

## 2015-02-09 DIAGNOSIS — H3531 Nonexudative age-related macular degeneration: Secondary | ICD-10-CM

## 2015-03-09 ENCOUNTER — Encounter (INDEPENDENT_AMBULATORY_CARE_PROVIDER_SITE_OTHER): Payer: Medicare HMO | Admitting: Ophthalmology

## 2015-03-09 DIAGNOSIS — H43813 Vitreous degeneration, bilateral: Secondary | ICD-10-CM

## 2015-03-09 DIAGNOSIS — H3531 Nonexudative age-related macular degeneration: Secondary | ICD-10-CM

## 2015-03-09 DIAGNOSIS — H3532 Exudative age-related macular degeneration: Secondary | ICD-10-CM | POA: Diagnosis not present

## 2015-05-11 ENCOUNTER — Encounter (INDEPENDENT_AMBULATORY_CARE_PROVIDER_SITE_OTHER): Payer: Medicare HMO | Admitting: Ophthalmology

## 2015-05-11 DIAGNOSIS — H3531 Nonexudative age-related macular degeneration: Secondary | ICD-10-CM | POA: Diagnosis not present

## 2015-05-11 DIAGNOSIS — H43813 Vitreous degeneration, bilateral: Secondary | ICD-10-CM | POA: Diagnosis not present

## 2015-05-11 DIAGNOSIS — H3532 Exudative age-related macular degeneration: Secondary | ICD-10-CM

## 2015-07-27 ENCOUNTER — Encounter (INDEPENDENT_AMBULATORY_CARE_PROVIDER_SITE_OTHER): Payer: Medicare HMO | Admitting: Ophthalmology

## 2015-07-27 DIAGNOSIS — H3531 Nonexudative age-related macular degeneration: Secondary | ICD-10-CM

## 2015-07-27 DIAGNOSIS — H3532 Exudative age-related macular degeneration: Secondary | ICD-10-CM

## 2015-07-27 DIAGNOSIS — H43813 Vitreous degeneration, bilateral: Secondary | ICD-10-CM

## 2015-08-02 ENCOUNTER — Other Ambulatory Visit (HOSPITAL_COMMUNITY): Payer: Self-pay | Admitting: Interventional Radiology

## 2015-08-02 DIAGNOSIS — N2889 Other specified disorders of kidney and ureter: Secondary | ICD-10-CM

## 2015-08-15 ENCOUNTER — Encounter: Payer: Self-pay | Admitting: *Deleted

## 2015-08-17 ENCOUNTER — Encounter (HOSPITAL_COMMUNITY): Payer: Self-pay | Admitting: Vascular Surgery

## 2015-08-17 ENCOUNTER — Emergency Department (HOSPITAL_COMMUNITY): Payer: Medicare HMO

## 2015-08-17 ENCOUNTER — Emergency Department (HOSPITAL_COMMUNITY)
Admission: EM | Admit: 2015-08-17 | Discharge: 2015-08-17 | Disposition: A | Discharge status: Home / Self Care | Payer: Medicare HMO | Attending: Emergency Medicine | Admitting: Emergency Medicine

## 2015-08-17 DIAGNOSIS — Z87448 Personal history of other diseases of urinary system: Secondary | ICD-10-CM | POA: Diagnosis not present

## 2015-08-17 DIAGNOSIS — K5900 Constipation, unspecified: Secondary | ICD-10-CM | POA: Diagnosis not present

## 2015-08-17 DIAGNOSIS — J449 Chronic obstructive pulmonary disease, unspecified: Secondary | ICD-10-CM | POA: Insufficient documentation

## 2015-08-17 DIAGNOSIS — H9190 Unspecified hearing loss, unspecified ear: Secondary | ICD-10-CM | POA: Insufficient documentation

## 2015-08-17 DIAGNOSIS — I10 Essential (primary) hypertension: Secondary | ICD-10-CM | POA: Insufficient documentation

## 2015-08-17 DIAGNOSIS — K6289 Other specified diseases of anus and rectum: Secondary | ICD-10-CM

## 2015-08-17 MED ORDER — POLYETHYLENE GLYCOL 3350 17 G PO PACK
17.0000 g | PACK | Freq: Every day | ORAL | Status: DC
  Filled 2015-08-17: qty 1

## 2015-08-17 MED ORDER — DOCUSATE SODIUM 100 MG PO CAPS: 100.0000 mg | ORAL_CAPSULE | Freq: Two times a day (BID) | ORAL | Status: DC

## 2015-08-17 NOTE — ED Notes (Signed)
NAD at this time. Pt is stable and going home.  

## 2015-08-17 NOTE — ED Notes (Signed)
Pt reports to the ED for eval of rectal pain that began today at approx 9:30 am. He reports the pain began before he had a BM this morning and it became worse after the BM. Denies any blood in his stool. Reports BM this am was normal. Pt has hx of renal mass. He also reports new onset urinary hesitancy. Denies any blood in his urine or dysuria. Pt A&Ox4, resp e/u, and skin warm and dry.

## 2015-08-17 NOTE — Discharge Instructions (Signed)
Take stool softeners as distracted and stay well-hydrated with water. If your rectal pain becomes persistent, blood in the stools, abdominal pain, vomiting or other concerns return to see her doctor or the emergency department for further evaluation.  If you were given medicines take as directed.  If you are on coumadin or contraceptives realize their levels and effectiveness is altered by many different medicines.  If you have any reaction (rash, tongues swelling, other) to the medicines stop taking and see a physician.    If your blood pressure was elevated in the ER make sure you follow up for management with a primary doctor or return for chest pain, shortness of breath or stroke symptoms.  Please follow up as directed and return to the ER or see a physician for new or worsening symptoms.  Thank you. Filed Vitals:   08/17/15 1125  BP: 140/57  Pulse: 52  Temp: 97.3 F (36.3 C)  TempSrc: Oral  Resp: 16  SpO2: 99%

## 2015-08-17 NOTE — ED Provider Notes (Signed)
CSN: [id number]     Arrival date & time 08/17/15  1111 History   First MD Initiated Contact with Patient 08/17/15 1138     Chief Complaint  Patient presents with  . Rectal Pain     (Consider location/radiation/quality/duration/timing/severity/associated sxs/prior Treatment) HPI Comments: 81 year old male with high blood pressure, chronic kidney disease, kidney mass, COPD, skin cancer, nonsmoker presents with rectal pain that was worse after the bowel movement this morning. Patient felt a bowel movement was softer, patient has been on any medication finasteride.  Patient is also on iron. Currently no abdominal or rectal pain. No GI cancer history. No colonoscopy history. No blood in the stools. Discomfort intermittent this morning.  The history is provided by the patient.    Past Medical History  Diagnosis Date  . Right kidney mass   . Chronic renal insufficiency   . Hypertension     Benign Essential Hypertension  . COPD (chronic obstructive pulmonary disease)   . Dermatitis   . Hearing loss   . Proteinuria   . Anosmia    Past Surgical History  Procedure Laterality Date  . Hernia repair     No family history on file. Social History  Substance Use Topics  . Smoking status: Never Smoker   . Smokeless tobacco: Never Used  . Alcohol Use: No    Review of Systems  Constitutional: Negative for fever and chills.  HENT: Negative for congestion.   Eyes: Negative for visual disturbance.  Respiratory: Negative for shortness of breath.   Cardiovascular: Negative for chest pain.  Gastrointestinal: Positive for constipation. Negative for vomiting and abdominal pain.  Genitourinary: Negative for dysuria and flank pain.  Musculoskeletal: Negative for back pain, neck pain and neck stiffness.  Skin: Negative for rash.  Neurological: Negative for light-headedness and headaches.      Allergies  Review of patient's allergies indicates no known allergies.  Home Medications   Prior  to Admission medications   Medication Sig Start Date End Date Taking? Authorizing Provider  docusate sodium (COLACE) 100 MG capsule Take 1 capsule (100 mg total) by mouth every 12 (twelve) hours. 08/17/15   Blane Ohara, MD   BP 140/57 mmHg  Pulse 52  Temp(Src) 97.3 F (36.3 C) (Oral)  Resp 16  SpO2 99% Physical Exam  Constitutional: He is oriented to person, place, and time. He appears well-developed and well-nourished.  HENT:  Head: Normocephalic and atraumatic.  Eyes: Conjunctivae are normal. Right eye exhibits no discharge. Left eye exhibits no discharge.  Neck: Normal range of motion. Neck supple. No tracheal deviation present.  Cardiovascular: Normal rate and regular rhythm.   Pulmonary/Chest: Effort normal and breath sounds normal.  Abdominal: Soft. He exhibits no distension. There is no tenderness. There is no guarding.  Genitourinary:  Patient has multiple hard stool pellets in the rectal area no obvious mass appreciated no bleeding.  Musculoskeletal: He exhibits no edema.  Neurological: He is alert and oriented to person, place, and time.  Skin: Skin is warm. No rash noted.  Psychiatric: He has a normal mood and affect.  Nursing note and vitals reviewed.   ED Course  Procedures (including critical care time) Labs Review Labs Reviewed - No data to display  Imaging Review No results found. I have personally reviewed and evaluated these images and lab results as part of my medical decision-making.   EKG Interpretation None      MDM   Final diagnoses:  Rectal pain  Constipation, unspecified constipation type  Well-appearing elderly male presents with clinically constipation. Plan for stool softeners and outpatient follow-up. Patient with age does not want overaggressive treatment and would not be a candidate for any aggressive surgery. Patient stable for outpatient follow-up.  I do not feel patient needs emergent CT scan at this time.  Results and  differential diagnosis were discussed with the patient/parent/guardian. Xrays were independently reviewed by myself.  Close follow up outpatient was discussed, comfortable with the plan.   Medications  polyethylene glycol (MIRALAX / GLYCOLAX) packet 17 g (not administered)    Filed Vitals:   08/17/15 1125  BP: 140/57  Pulse: 52  Temp: 97.3 F (36.3 C)  TempSrc: Oral  Resp: 16  SpO2: 99%    Final diagnoses:  Rectal pain  Constipation, unspecified constipation type       Blane Ohara, MD 08/17/15 1323

## 2015-08-29 ENCOUNTER — Ambulatory Visit (HOSPITAL_COMMUNITY): Payer: Medicare HMO

## 2015-08-29 ENCOUNTER — Ambulatory Visit (HOSPITAL_COMMUNITY)
Admission: RE | Admit: 2015-08-29 | Discharge: 2015-08-29 | Disposition: A | Discharge status: Home / Self Care | Payer: Medicare HMO | Source: Ambulatory Visit | Attending: Interventional Radiology | Admitting: Interventional Radiology

## 2015-08-29 DIAGNOSIS — R935 Abnormal findings on diagnostic imaging of other abdominal regions, including retroperitoneum: Secondary | ICD-10-CM | POA: Diagnosis not present

## 2015-08-29 DIAGNOSIS — N2889 Other specified disorders of kidney and ureter: Secondary | ICD-10-CM | POA: Diagnosis not present

## 2015-09-07 ENCOUNTER — Encounter (INDEPENDENT_AMBULATORY_CARE_PROVIDER_SITE_OTHER): Payer: Medicare HMO | Admitting: Ophthalmology

## 2015-09-07 DIAGNOSIS — H3532 Exudative age-related macular degeneration: Secondary | ICD-10-CM

## 2015-09-07 DIAGNOSIS — H3531 Nonexudative age-related macular degeneration: Secondary | ICD-10-CM | POA: Diagnosis not present

## 2015-09-07 DIAGNOSIS — H43813 Vitreous degeneration, bilateral: Secondary | ICD-10-CM | POA: Diagnosis not present

## 2015-09-11 ENCOUNTER — Ambulatory Visit
Admission: RE | Admit: 2015-09-11 | Discharge: 2015-09-11 | Disposition: A | Discharge status: Home / Self Care | Payer: Commercial Managed Care - HMO | Source: Ambulatory Visit | Attending: Interventional Radiology | Admitting: Interventional Radiology

## 2015-09-11 DIAGNOSIS — N2889 Other specified disorders of kidney and ureter: Secondary | ICD-10-CM | POA: Insufficient documentation

## 2015-09-11 NOTE — Progress Notes (Signed)
Patient ID: Brett Perry, male   DOB: 23-Sep-1920, 81 y.o.   MRN: [id number]       Chief Complaint: Patient was seen in consultation today for  Chief Complaint  Patient presents with  . Follow-up    Surveillance of Right Renal Mass     at the request of Edit Ricciardelli  Referring Physician(s):Hall  History of Present Illness: Brett Perry is a 81 y.o. male who returns for outpatient follow-up of an exophytic solid right renal mass. Patient remains asymptomatic. No current abdominal pain, flank pain, hematuria, or change in urinary symptoms. Most recent CT demonstrates stable to slight increase in the exophytic right renal mass now measuring 4.9 x 4.0 cm, previously measuring 4.6 x 3.6 cm. No evidence of acute hemorrhage or new finding. No adenopathy. No hydronephrosis. Overall he is stable.  Past Medical History  Diagnosis Date  . Right kidney mass   . Chronic renal insufficiency   . Hypertension     Benign Essential Hypertension  . COPD (chronic obstructive pulmonary disease)   . Dermatitis   . Hearing loss   . Proteinuria   . Anosmia     Past Surgical History  Procedure Laterality Date  . Hernia repair      Allergies: Review of patient's allergies indicates no known allergies.  Medications: Prior to Admission medications   Medication Sig Start Date End Date Taking? Authorizing Provider  doxazosin (CARDURA) 2 MG tablet Take 2 mg by mouth daily.   Yes Historical Provider, MD  ergocalciferol (VITAMIN D2) 50000 UNITS capsule Take 50,000 Units by mouth once a week.   Yes Historical Provider, MD  Ferrous Sulfate (IRON) 325 (65 FE) MG TABS Take 65 capsules by mouth daily.   Yes Historical Provider, MD  finasteride (PROSCAR) 5 MG tablet Take 5 mg by mouth daily.   Yes Historical Provider, MD  Multiple Vitamin (MULTIVITAMIN) tablet Take 1 tablet by mouth daily.   Yes Historical Provider, MD  Multiple Vitamins-Minerals (ICAPS AREDS 2 PO) Take 2 capsules by mouth daily.   Yes  Historical Provider, MD  Omega-3 Fatty Acids (FISH OIL) 1000 MG CAPS Take by mouth.   Yes Historical Provider, MD  docusate sodium (COLACE) 100 MG capsule Take 1 capsule (100 mg total) by mouth every 12 (twelve) hours. 08/17/15   Blane Ohara, MD     No family history on file.  Social History   Social History  . Marital Status: Married    Spouse Name: N/A  . Number of Children: N/A  . Years of Education: N/A   Social History Main Topics  . Smoking status: Never Smoker   . Smokeless tobacco: Never Used  . Alcohol Use: No  . Drug Use: No  . Sexual Activity: Not on file   Other Topics Concern  . Not on file   Social History Narrative    ECOG Status: 0 - Asymptomatic  Review of Systems: A 12 point ROS discussed and pertinent positives are indicated in the HPI above.  All other systems are negative.  Review of Systems  Constitutional: Negative for fever, chills, diaphoresis, activity change, appetite change, fatigue and unexpected weight change.  Respiratory: Negative for cough, chest tightness and shortness of breath.   Cardiovascular: Negative for chest pain.    Vital Signs: BP 117/58 mmHg  Pulse 52  Temp(Src) 97.2 F (36.2 C) (Oral)  Resp 14  Ht  (1.778 m)  Wt 135 lb (61.236 kg)  BMI 19.37 kg/m2  SpO2 100%  Physical Exam  Constitutional: He is oriented to person, place, and time. He appears well-developed and well-nourished.  Frail elderly male in no distress.  Abdominal: Soft. Bowel sounds are normal. He exhibits no distension and no mass. There is no guarding.  Neurological: He is alert and oriented to person, place, and time.  Skin: Skin is warm. Rash noted.  Psychiatric: He has a normal mood and affect. His behavior is normal. Judgment and thought content normal.    Imaging: Ct Abdomen Wo Contrast  08/29/2015   CLINICAL DATA:  Surveillance of right renal mass. No current complaints. Chronic renal insufficiency.  EXAM: CT ABDOMEN WITHOUT CONTRAST   TECHNIQUE: Multidetector CT imaging of the abdomen was performed following the standard protocol without IV contrast.  COMPARISON:  01/15/2015  FINDINGS: Lower chest: Clear lung bases. Normal heart size without pericardial or pleural effusion. A pectus excavatum deformity.  Hepatobiliary: Scattered hepatic cysts. Normal gallbladder, without biliary ductal dilatation.  Pancreas: Normal, without mass or ductal dilatation.  Spleen: Normal  Adrenals/Urinary Tract: Normal adrenal glands. Bilateral renal cortical thinning. An interpolar left renal lesion posteriorly measures fluid density and 1.5 cm, likely a cyst.  Partially exophytic lower pole right renal mass 4.9 by 4.0 cm on image 29 of series 2. Compare 4.6 x 3.6 cm on the prior exam. No hydronephrosis. No abdominal hydroureter.  Stomach/Bowel: Normal stomach, without wall thickening. Normal abdominal portions of the colon and terminal ileum. Normal caliber of small bowel loops. Appendicoliths versus contrast within a normal sized appendix, including on image 38. This is incompletely imaged.  Vascular/Lymphatic: Infrarenal abdominal aortic dilatation. Maximally 3.5 cm today, similar. No surrounding hemorrhage. Extension of dilatation into the right common iliac artery at 1.8 cm. No retroperitoneal or retrocrural adenopathy.  Other: No ascites. Interstitial thickening is identified along the anterior aspect of the right psoas muscle and the inferior to the right kidney. Example on image 37 of series 2. Felt to be slightly increased, especially when compared back to 12/15/2013.  Musculoskeletal: Convex left lumbar spine curvature. No focal osseous lesion. Lumbosacral spondylosis.  IMPRESSION: 1. Further mild enlargement of a right lower pole renal lesion. 2. No evidence of metastatic disease within the abdomen. 3. Similar infrarenal aortic dilatation with extension into the right common iliac artery. 4. Interstitial thickening along the anterior aspect of the right  psoas muscle. Felt to be slightly increased when compared back to the 2014 exam. Nonspecific and incompletely imaged. Correlate with any symptoms of right lower quadrant pain, as colitis/enteritis or even chronic appendicitis could have this appearance. If there are any such symptoms, consider pelvic imaging with enteric contrast.   Electronically Signed   By: Jeronimo Greaves M.D.   On: 08/29/2015 12:08   Dg Abd 2 Views  08/17/2015   CLINICAL DATA:  Abdominal and rectal pain today.  EXAM: ABDOMEN - 2 VIEW  COMPARISON:  CT scan 01/15/2015.  FINDINGS: The bowel gas pattern is unremarkable. There is scattered air and stool in the colon and scattered loops of small bowel with air. No findings small bowel obstruction or free air. The soft tissue shadows are grossly maintained. Vascular calcifications are noted. Advanced degenerative changes involving both hips, left greater than right.  IMPRESSION: No plain film findings for an acute abdominal process.   Electronically Signed   By: Rudie Meyer M.D.   On: 08/17/2015 13:46    Labs:  CBC: White blood cell count 4.1, hemoglobin 9.1, hematocrit 28, platelet count 184 (06/05/2015)  BMP: Cr 5.9 (  06/05/2015)  Assessment and Plan:  81 year old male with a stable to minimally increased exophytic solid right renal neoplasm. Patient remains asymptomatic. No current flank or abdominal pain. No hematuria. No significant evidence of progression or metastatic disease. After our discussion today, the patient and his wife are comfortable with no longer performing CT scans. The lesion has only changed from 3.6 x 3.0 cm to 4.9 x 4.0 cm in 2 years. At his elderly age and other comorbidities, he understands he will likely die from natural causes rather than this renal neoplasm. He is also closely followed by nephrology and urology in [facility].  No further follow-up will be performed with interventional radiology at this point.  Thank you for this interesting consult.  I  greatly enjoyed meeting GOVERNOR MATOS and look forward to participating in their care.  A copy of this report was sent to the requesting provider on this date.  SignedBerdine Dance 09/11/2015, 3:31 PM   I spent a total of    25 Minutes in face to face in clinical consultation, greater than 50% of which was counseling/coordinating care for the patient.

## 2015-11-02 ENCOUNTER — Encounter (INDEPENDENT_AMBULATORY_CARE_PROVIDER_SITE_OTHER): Payer: Medicare HMO | Admitting: Ophthalmology

## 2015-11-02 DIAGNOSIS — H353122 Nonexudative age-related macular degeneration, left eye, intermediate dry stage: Secondary | ICD-10-CM

## 2015-11-02 DIAGNOSIS — H353211 Exudative age-related macular degeneration, right eye, with active choroidal neovascularization: Secondary | ICD-10-CM

## 2015-11-02 DIAGNOSIS — H43813 Vitreous degeneration, bilateral: Secondary | ICD-10-CM

## 2015-11-22 ENCOUNTER — Encounter (HOSPITAL_COMMUNITY): Payer: Self-pay

## 2015-11-22 ENCOUNTER — Emergency Department (HOSPITAL_COMMUNITY): Payer: Medicare PPO

## 2015-11-22 ENCOUNTER — Observation Stay (HOSPITAL_COMMUNITY)
Admission: EM | Admit: 2015-11-22 | Discharge: 2015-11-23 | Disposition: A | Discharge status: Home / Self Care | Payer: Medicare PPO | Attending: Family Medicine | Admitting: Family Medicine

## 2015-11-22 DIAGNOSIS — J449 Chronic obstructive pulmonary disease, unspecified: Secondary | ICD-10-CM | POA: Insufficient documentation

## 2015-11-22 DIAGNOSIS — Z872 Personal history of diseases of the skin and subcutaneous tissue: Secondary | ICD-10-CM | POA: Diagnosis not present

## 2015-11-22 DIAGNOSIS — H3530 Unspecified macular degeneration: Secondary | ICD-10-CM | POA: Diagnosis not present

## 2015-11-22 DIAGNOSIS — J189 Pneumonia, unspecified organism: Secondary | ICD-10-CM

## 2015-11-22 DIAGNOSIS — R600 Localized edema: Secondary | ICD-10-CM

## 2015-11-22 DIAGNOSIS — H9190 Unspecified hearing loss, unspecified ear: Secondary | ICD-10-CM | POA: Insufficient documentation

## 2015-11-22 DIAGNOSIS — Z789 Other specified health status: Secondary | ICD-10-CM

## 2015-11-22 DIAGNOSIS — Z79899 Other long term (current) drug therapy: Secondary | ICD-10-CM | POA: Insufficient documentation

## 2015-11-22 DIAGNOSIS — M79651 Pain in right thigh: Secondary | ICD-10-CM | POA: Diagnosis not present

## 2015-11-22 DIAGNOSIS — N185 Chronic kidney disease, stage 5: Secondary | ICD-10-CM | POA: Diagnosis not present

## 2015-11-22 DIAGNOSIS — J159 Unspecified bacterial pneumonia: Secondary | ICD-10-CM | POA: Diagnosis not present

## 2015-11-22 DIAGNOSIS — I129 Hypertensive chronic kidney disease with stage 1 through stage 4 chronic kidney disease, or unspecified chronic kidney disease: Secondary | ICD-10-CM | POA: Insufficient documentation

## 2015-11-22 DIAGNOSIS — N189 Chronic kidney disease, unspecified: Secondary | ICD-10-CM | POA: Insufficient documentation

## 2015-11-22 DIAGNOSIS — R0989 Other specified symptoms and signs involving the circulatory and respiratory systems: Secondary | ICD-10-CM | POA: Insufficient documentation

## 2015-11-22 DIAGNOSIS — M79652 Pain in left thigh: Principal | ICD-10-CM

## 2015-11-22 DIAGNOSIS — M79606 Pain in leg, unspecified: Secondary | ICD-10-CM

## 2015-11-22 HISTORY — DX: Chronic kidney disease, unspecified: N18.9

## 2015-11-22 HISTORY — DX: Unspecified macular degeneration: H35.30

## 2015-11-22 LAB — CBC WITH DIFFERENTIAL/PLATELET
Basophils Absolute: 0 10*3/uL (ref 0.0–0.1)
Basophils Relative: 0 %
Eosinophils Absolute: 0.2 10*3/uL (ref 0.0–0.7)
Eosinophils Relative: 3 %
HEMATOCRIT: 26.6 % — AB (ref 39.0–52.0)
HEMOGLOBIN: 9 g/dL — AB (ref 13.0–17.0)
LYMPHS PCT: 9 %
Lymphs Abs: 0.6 10*3/uL — ABNORMAL LOW (ref 0.7–4.0)
MCH: 30 pg (ref 26.0–34.0)
MCHC: 33.8 g/dL (ref 30.0–36.0)
MCV: 88.7 fL (ref 78.0–100.0)
MONO ABS: 0.5 10*3/uL (ref 0.1–1.0)
MONOS PCT: 8 %
NEUTROS ABS: 5.4 10*3/uL (ref 1.7–7.7)
NEUTROS PCT: 80 %
Platelets: 170 10*3/uL (ref 150–400)
RBC: 3 MIL/uL — ABNORMAL LOW (ref 4.22–5.81)
RDW: 14.4 % (ref 11.5–15.5)
WBC: 6.8 10*3/uL (ref 4.0–10.5)

## 2015-11-22 LAB — BASIC METABOLIC PANEL
ANION GAP: 10 (ref 5–15)
BUN: 50 mg/dL — ABNORMAL HIGH (ref 6–20)
CHLORIDE: 116 mmol/L — AB (ref 101–111)
CO2: 17 mmol/L — AB (ref 22–32)
Calcium: 9.6 mg/dL (ref 8.9–10.3)
Creatinine, Ser: 6.08 mg/dL — ABNORMAL HIGH (ref 0.61–1.24)
GFR calc non Af Amer: 7 mL/min — ABNORMAL LOW (ref >60)
GFR, EST AFRICAN AMERICAN: 8 mL/min — AB (ref >60)
GLUCOSE: 115 mg/dL — AB (ref 65–99)
POTASSIUM: 4 mmol/L (ref 3.5–5.1)
Sodium: 143 mmol/L (ref 135–145)

## 2015-11-22 LAB — PROTIME-INR
INR: 1.37 (ref 0.00–1.49)
PROTHROMBIN TIME: 17 s — AB (ref 11.6–15.2)

## 2015-11-22 LAB — APTT: APTT: 36 s (ref 24–37)

## 2015-11-22 LAB — LACTIC ACID, PLASMA: Lactic Acid, Venous: 0.7 mmol/L (ref 0.5–2.0)

## 2015-11-22 LAB — CK: CK TOTAL: 25 U/L — AB (ref 49–397)

## 2015-11-22 MED ORDER — DEXTROSE 5 % IV SOLN
500.0000 mg | INTRAVENOUS | Status: DC
  Administered 2015-11-22: 500 mg via INTRAVENOUS
  Filled 2015-11-22 (×2): qty 500

## 2015-11-22 MED ORDER — HYDROCODONE-ACETAMINOPHEN 5-325 MG PO TABS
1.0000 | ORAL_TABLET | ORAL | Status: DC | PRN
  Administered 2015-11-22: 2 via ORAL
  Administered 2015-11-22: 1 via ORAL
  Filled 2015-11-22: qty 2
  Filled 2015-11-22: qty 1

## 2015-11-22 MED ORDER — FINASTERIDE 5 MG PO TABS
5.0000 mg | ORAL_TABLET | Freq: Every day | ORAL | Status: DC
  Administered 2015-11-22 – 2015-11-23 (×2): 5 mg via ORAL
  Filled 2015-11-22 (×8): qty 1

## 2015-11-22 MED ORDER — SODIUM CHLORIDE 0.9 % IV SOLN
INTRAVENOUS | Status: DC
  Administered 2015-11-22: 03:00:00 via INTRAVENOUS

## 2015-11-22 MED ORDER — FENTANYL CITRATE (PF) 100 MCG/2ML IJ SOLN
25.0000 ug | Freq: Once | INTRAMUSCULAR | Status: AC
  Administered 2015-11-22: 25 ug via INTRAVENOUS
  Filled 2015-11-22: qty 2

## 2015-11-22 MED ORDER — DEXTROSE 5 % IV SOLN
1.0000 g | INTRAVENOUS | Status: DC
  Filled 2015-11-22 (×4): qty 10

## 2015-11-22 MED ORDER — ACETAMINOPHEN 325 MG PO TABS: 650.0000 mg | ORAL_TABLET | Freq: Four times a day (QID) | ORAL | Status: DC | PRN

## 2015-11-22 MED ORDER — GATIFLOXACIN 0.5 % OP SOLN
1.0000 [drp] | Freq: Four times a day (QID) | OPHTHALMIC | Status: DC
  Administered 2015-11-22: 1 [drp] via OPHTHALMIC
  Filled 2015-11-22: qty 2.5

## 2015-11-22 MED ORDER — DOXAZOSIN MESYLATE 2 MG PO TABS
2.0000 mg | ORAL_TABLET | Freq: Every day | ORAL | Status: DC
  Administered 2015-11-23: 2 mg via ORAL
  Filled 2015-11-22 (×2): qty 1

## 2015-11-22 MED ORDER — SODIUM BICARBONATE 650 MG PO TABS
1300.0000 mg | ORAL_TABLET | Freq: Every day | ORAL | Status: DC
  Administered 2015-11-23: 1300 mg via ORAL
  Filled 2015-11-22: qty 2

## 2015-11-22 MED ORDER — DEXTROSE 5 % IV SOLN
1.0000 g | Freq: Once | INTRAVENOUS | Status: AC
  Administered 2015-11-22: 1 g via INTRAVENOUS
  Filled 2015-11-22: qty 10

## 2015-11-22 MED ORDER — AZITHROMYCIN 250 MG PO TABS
500.0000 mg | ORAL_TABLET | ORAL | Status: DC
  Administered 2015-11-23: 500 mg via ORAL
  Filled 2015-11-22: qty 2

## 2015-11-22 NOTE — H&P (Signed)
History and Physical  Brett Perry WUJ:[id number] DOB: 1920-07-12 DOA: 11/22/2015  Referring physician: Devoria Albe, MD PCP: [name], MD    Chief Complaint: Leg pain   HPI:  39 yom with past medical CAD, HTN renal mass presented with complaints of consistent bilateral thigh pain that onset last night around 0100. Pain is described as muscular, cramping, non-radiating, and severity is 10/10. No relief with Tylenol and heating pads. Complains of toe numbness on left that has been intermittent for the last few weeks. Admits coughing for last three days. Denies fever, sore throat, new rashes, chest pain, SOB, dysuria, cough, nausea, vomiting. CXR in ED revealed right base pneumonia.    In the emergency department Afebrile, not hypoxic, VSS. Treated with abx and Fentyl Pertinent labs: Creatinine 6.05, with last blood work 2008 Creatinine was 1.81. CK, WBC, and lactic acid normal, and Hgb 9.0 with last 2008 EKG: Independently reviewed.  Imaging: Bilateral ABI normal. CT A/P: Right base pneumonia. Renal mass diffused abdominal aortic calcification.  CXR independently reviewed: Right base pneumonia   Review of Systems:  Positive for bilateral thigh pain, cough Negative for fever, visual changes, sore throat, rash, new muscle aches, chest pain, SOB, dysuria, bleeding, n/v/abdominal pain.  Past Medical History  Diagnosis Date  . Right kidney mass   . CKD (chronic kidney disease)   . Hypertension     Benign Essential Hypertension  . COPD (chronic obstructive pulmonary disease) (HCC)     pt and wife deny  . Dermatitis   . Hearing loss   . Proteinuria   . Anosmia   . Macular degeneration     Past Surgical History  Procedure Laterality Date  . Hernia repair      Social History:  reports that he has never smoked. He has never used smokeless tobacco. He reports that he does not drink alcohol or use illicit drugs. lives with their spouse Self-care  No Known Allergies  Family  History  Problem Relation Age of Onset  . Aneurysm Brother      Prior to Admission medications   Medication Sig Start Date End Date Taking? Authorizing Provider  Besifloxacin HCl 0.6 % SUSP Apply 1 drop to eye 4 (four) times daily. After injection at eye doctor for macular degeneration.   Yes Historical Provider, MD  docusate sodium (COLACE) 100 MG capsule Take 1 capsule (100 mg total) by mouth every 12 (twelve) hours. 08/17/15  Yes Blane Ohara, MD  doxazosin (CARDURA) 2 MG tablet Take 2 mg by mouth daily.   Yes Historical Provider, MD  ergocalciferol (VITAMIN D2) 50000 UNITS capsule Take 50,000 Units by mouth once a week.   Yes Historical Provider, MD  Ferrous Sulfate (IRON) 325 (65 FE) MG TABS Take 65 capsules by mouth daily.   Yes Historical Provider, MD  finasteride (PROSCAR) 5 MG tablet Take 5 mg by mouth daily.   Yes Historical Provider, MD  Multiple Vitamin (MULTIVITAMIN) tablet Take 1 tablet by mouth daily.   Yes Historical Provider, MD  Multiple Vitamins-Minerals (ICAPS AREDS 2 PO) Take 2 capsules by mouth daily.   Yes Historical Provider, MD  Omega-3 Fatty Acids (FISH OIL) 1000 MG CAPS Take by mouth.   Yes Historical Provider, MD  sodium bicarbonate 650 MG tablet Take 1,300 mg by mouth daily.  06/08/15  Yes Historical Provider, MD   Physical Exam: Filed Vitals:   11/22/15 0533 11/22/15 0804 11/22/15 0830 11/22/15 0900  BP: 104/51 96/55 102/48 97/54  Pulse: 87 71 73  73  Temp: 99.5 F (37.5 C)     TempSrc: Oral     Resp: 15 18    Height:      Weight:      SpO2: 94% 97% 94% 96%    General:  Appears calm and comfortable. Does not appear ill or toxic. Hard of hearing.  Eyes: PERRL, normal lids, irises & conjunctiva ENT: lips & tongue appear unremarkable Neck: no LAD, masses or thyromegaly Cardiovascular: RRR, no m/r/g. No LE edema. Bilateral strong femoral pulses; strong left popliteal pules; 2+ DP right foot. Non-palpate DP on the left. Left foot nontender; perfusion  appears intact; skin pink, capillary refill brisk left toes.  Respiratory: CTA bilaterally, no w/r/r. Normal respiratory effort. Abdomen: soft, ntnd Skin: Skin on bilateral feet unremarkable.   Musculoskeletal: grossly normal tone BUE/BLE. Sensation intact BLE.   Psychiatric: grossly normal mood and affect, speech fluent and appropriate Neurologic: grossly non-focal.  Wt Readings from Last 3 Encounters:  11/22/15 62.143 kg (137 lb)  09/11/15 61.236 kg (135 lb)  02/08/15 64.411 kg (142 lb)    Labs on Admission:  Basic Metabolic Panel:  Recent Labs Lab 11/22/15 0315  NA 143  K 4.0  CL 116*  CO2 17*  GLUCOSE 115*  BUN 50*  CREATININE 6.08*  CALCIUM 9.6   CBC:  Recent Labs Lab 11/22/15 0315  WBC 6.8  NEUTROABS 5.4  HGB 9.0*  HCT 26.6*  MCV 88.7  PLT 170    Cardiac Enzymes:  Recent Labs Lab 11/22/15 0315  CKTOTAL 25*      Radiological Exams on Admission: Ct Abdomen Pelvis Wo Contrast  11/22/2015  CLINICAL DATA:  Assess known abdominal aortic aneurysm. Subsequent encounter. EXAM: CT ABDOMEN AND PELVIS WITHOUT CONTRAST TECHNIQUE: Multidetector CT imaging of the abdomen and pelvis was performed following the standard protocol without IV contrast. COMPARISON:  CT of the abdomen and pelvis from 08/29/2015 FINDINGS: Dense right basilar airspace opacification is compatible with pneumonia. Scattered vague hypodensities are seen in the liver, measuring up to 1.5 cm in size. These are nonspecific but may reflect cysts. The spleen is unremarkable in appearance. The gallbladder is grossly unremarkable, though difficult to fully assess due to motion artifact. The pancreas and adrenal glands are unremarkable. The exophytic mass arising at the lower pole of the right kidney appears to have increased mildly in size, measuring 5.1 x 4.1 cm. As before, this is concerning for malignancy. Nonspecific perinephric stranding is noted bilaterally. Mild bilateral renal atrophy is seen. There  is no evidence of hydronephrosis. No renal or ureteral stones are identified. No free fluid is identified. The small bowel is unremarkable in appearance. The stomach is within normal limits. No acute vascular abnormalities are seen. Relatively diffuse calcification is seen along the abdominal aorta and its branches. There is relatively stable aneurysmal dilatation of the infrarenal abdominal aorta to 3.5 cm in maximal AP dimension. Ectasia is noted along the common iliac arteries bilaterally. The appendix is normal in caliber, without evidence for appendicitis. The colon is partially filled with stool. Scattered diverticulosis is noted along the proximal sigmoid colon, without evidence of diverticulitis. The bladder is moderately distended; there is suggestion of a right-sided bladder diverticulum. The prostate is grossly unremarkable in appearance. No inguinal lymphadenopathy is seen. No acute osseous abnormalities are identified. Multilevel disc space narrowing is noted along the lumbar spine, with vacuum phenomenon at L2-L3, and mild grade 1 anterolisthesis of L3 on L4, reflecting underlying facet disease. IMPRESSION: 1. Right basilar pneumonia  noted. 2. Exophytic mass at the lower pole the right kidney has increased mildly in size, measuring 5.1 x 4.1 cm, and remains concerning for malignancy. This is being followed by Urology. 3. Scattered vague hypodensities in the liver, measuring up to 1.5 cm in size and grossly stable in appearance. These are nonspecific but may reflect cysts. 4. Relatively diffuse calcification along the abdominal aorta and its branches. Relatively stable aneurysmal dilatation of the infrarenal abdominal aorta to 3.5 cm in maximal AP dimension. Recommend followup by ultrasound in 2 years. This recommendation follows ACR consensus guidelines: White Paper of the ACR Incidental Findings Committee II on Vascular Findings. J Am Coll Radiol 2013; 10:789-794. 5. Scattered diverticulosis along the  proximal sigmoid colon, without evidence of diverticulitis. 6. Suggestion of right-sided bladder diverticulum. Bladder otherwise grossly unremarkable. 7. Minimal degenerative change along the lumbar spine. Electronically Signed   By: Roanna RaiderJeffery  Chang M.D.   On: 11/22/2015 05:08   Dg Chest 2 View  11/22/2015  CLINICAL DATA:  Acute onset of dry cough. Concern for pneumonia on CT of the abdomen and pelvis. Initial encounter. EXAM: CHEST  2 VIEW COMPARISON:  CT of the abdomen and pelvis performed earlier today at 4:29 a.m. FINDINGS: Right basilar airspace opacity is compatible with pneumonia, as noted on recent CT. Peribronchial thickening is noted. No pleural effusion or pneumothorax is seen. The heart remains normal in size. No acute osseous abnormalities are identified. IMPRESSION: Right basilar pneumonia, as noted on recent CT. Peribronchial thickening seen. Electronically Signed   By: Roanna RaiderJeffery  Chang M.D.   On: 11/22/2015 06:30   [place] Arterial Seg Single  11/22/2015  CLINICAL DATA:  Intermittent bilateral toe numbness EXAM: NONINVASIVE PHYSIOLOGIC VASCULAR STUDY OF BILATERAL LOWER EXTREMITIES TECHNIQUE: Evaluation of both lower extremities were performed at rest, including calculation of ankle-brachial indices with single level Doppler, pressure and pulse volume recording. COMPARISON:  None. FINDINGS: Right ABI:  1.15 Left ABI:  1.16 Right Lower Extremity: Normal waveforms are noted in the distal vessels. Left Lower Extremity: Slight blunting of the waveforms noted although the ankle brachial index is within normal limits. IMPRESSION: Normal ankle-brachial index bilaterally. Electronically Signed   By: Alcide CleverMark  Lukens M.D.   On: 11/22/2015 10:09      Principal Problem:   Pneumonia Active Problems:   CKD (chronic kidney disease), stage V (HCC)   Bilateral thigh pain   Assessment/Plan 1. CAP. Afebrile, lactic acid and WBC WNL. No hypoxia.  2. Bilateral thigh pain, normal CK, normal femoral pulses.  Improved with analgesic. Etiology unclear, favor muscular from work 11/23 or neuropathic. No evidence of ischemia.  3. Suspected CKD Stage V with suspected chronic metabolic acidosis. Not on HD. Followed by nephrology at [facility].   Appears stable, plan obs  Empiric abx, pain control  Follow clinically; reviewed exam, history and imaging with Dr. Myra GianottiBrabham, [facility]; favor neuropathic, low likelihood of vascular issue based on exam and ABI. Recommends clinical observation.  Code Status: Fulll  DVT prophylaxis:SCDs Family Communication: Wife bedside.  Disposition Plan/Anticipated LOS: Admit to medical bed for obs. Anticipate discharge within next 24 hours.   Time spent: 60 minutes  Brendia Sacksaniel Goodrich, MD  [facility] Pager [phone] 11/22/2015, 11:08 AM    By signing my name below, I, Zadie CleverlyJessica Augustus attest that this documentation has been prepared under the direction and in the presence of Brendia Sacksaniel Goodrich, MD Electronically signed: Zadie CleverlyJessica Augustus  11/22/2015 11:01am  I personally performed the services described in this documentation. All medical record entries  made by the scribe were at my direction. I have reviewed the chart and agree that the record reflects my personal performance and is accurate and complete. Brendia Sacks, MD

## 2015-11-22 NOTE — Progress Notes (Signed)
ANTIBIOTIC CONSULT NOTE-  Pharmacy Consult for Renal dose adjustment if needed  No Known Allergies  Patient Measurements: Height: 5\' 10"  (177.8 cm) Weight: 137 lb (62.143 kg) IBW/kg (Calculated) : 73  Vital Signs: Temp: 98 F (36.7 C) (11/24 1205) Temp Source: Oral (11/24 1205) BP: 110/43 mmHg (11/24 1205) Pulse Rate: 66 (11/24 1205)  Labs:  Recent Labs  11/22/15 0315  WBC 6.8  HGB 9.0*  PLT 170  CREATININE 6.08*   Estimated Creatinine Clearance: 6.7 mL/min (by C-G formula based on Cr of 6.08).  No results for input(s): VANCOTROUGH, VANCOPEAK, VANCORANDOM, GENTTROUGH, GENTPEAK, GENTRANDOM, TOBRATROUGH, TOBRAPEAK, TOBRARND, AMIKACINPEAK, AMIKACINTROU, AMIKACIN in the last 72 hours.   Microbiology: No results found for this or any previous visit (from the past 720 hour(s)).  Medical History: Past Medical History  Diagnosis Date  . Right kidney mass   . CKD (chronic kidney disease)   . Hypertension     Benign Essential Hypertension  . COPD (chronic obstructive pulmonary disease) (HCC)     pt and wife deny  . Dermatitis   . Hearing loss   . Proteinuria   . Anosmia   . Macular degeneration    Assessment: Estimated Creatinine Clearance: 6.7 mL/min (by C-G formula based on Cr of 6.08). Pt started on Rocephin and Zithromax.  No renal adjustment needed.  Plan:  Continue current Rx  Wayland DenisHall, Cason Dabney A, RPH 11/22/2015,1:07 PM

## 2015-11-22 NOTE — ED Provider Notes (Signed)
CSN: [id number]     Arrival date & time 11/22/15  0219 History   First MD Initiated Contact with Patient 11/22/15 0250     Chief Complaint  Patient presents with  . Leg Pain   Level V caveat for dementia  (Consider location/radiation/quality/duration/timing/severity/associated sxs/prior Treatment) HPI patient presents to the ED with his wife who reports about 11:30 PM he suddenly started complaining of pain in both his thighs. She states he never complains about anything and this is something different. He denies abdominal pain, nausea, vomiting, but does state his feet feel cold and his left toe feels numb. He has noted has some swelling of his left foot which his wife states is not normal. Wife states the only thing that was different about today is that patient was on his knees cleaning out the oven today however she states he normally does all the housework and does all the yard work. He does not have a history of coronary artery disease or hypertension. He does not have a cardiologist or a vascular surgeon.  Both patient and wife are noted to be coughing at times. She states they have not had fevers. She states they have not felt bad.  PCP Dr Kelby Fam  Past Medical History  Diagnosis Date  . Right kidney mass   . Chronic renal insufficiency   . Hypertension     Benign Essential Hypertension  . COPD (chronic obstructive pulmonary disease) (HCC)   . Dermatitis   . Hearing loss   . Proteinuria   . Anosmia    Past Surgical History  Procedure Laterality Date  . Hernia repair     History reviewed. No pertinent family history. Social History  Substance Use Topics  . Smoking status: Never Smoker   . Smokeless tobacco: Never Used  . Alcohol Use: No  lives at home Lives with spouse  Review of Systems  All other systems reviewed and are negative.     Allergies  Review of patient's allergies indicates no known allergies.  Home Medications   Prior to Admission medications    Medication Sig Start Date End Date Taking? Authorizing Provider  docusate sodium (COLACE) 100 MG capsule Take 1 capsule (100 mg total) by mouth every 12 (twelve) hours. 08/17/15  Yes Blane Ohara, MD  doxazosin (CARDURA) 2 MG tablet Take 2 mg by mouth daily.   Yes Historical Provider, MD  ergocalciferol (VITAMIN D2) 50000 UNITS capsule Take 50,000 Units by mouth once a week.   Yes Historical Provider, MD  Ferrous Sulfate (IRON) 325 (65 FE) MG TABS Take 65 capsules by mouth daily.   Yes Historical Provider, MD  finasteride (PROSCAR) 5 MG tablet Take 5 mg by mouth daily.   Yes Historical Provider, MD  Multiple Vitamin (MULTIVITAMIN) tablet Take 1 tablet by mouth daily.   Yes Historical Provider, MD  Multiple Vitamins-Minerals (ICAPS AREDS 2 PO) Take 2 capsules by mouth daily.   Yes Historical Provider, MD  Omega-3 Fatty Acids (FISH OIL) 1000 MG CAPS Take by mouth.   Yes Historical Provider, MD   BP 144/71 mmHg  Pulse 90  Temp(Src) 98.1 F (36.7 C) (Oral)  Resp 18  Ht 5\' 10"  (1.778 m)  Wt 137 lb (62.143 kg)  BMI 19.66 kg/m2  SpO2 94%  Vital signs normal   Physical Exam  Constitutional: He appears well-developed and well-nourished.  Non-toxic appearance. He does not appear ill. No distress.  HENT:  Head: Normocephalic and atraumatic.  Right Ear: External ear normal.  Left Ear: External ear normal.  Nose: Nose normal. No mucosal edema or rhinorrhea.  Mouth/Throat: Oropharynx is clear and moist and mucous membranes are normal. No dental abscesses or uvula swelling.  Eyes: Conjunctivae and EOM are normal. Pupils are equal, round, and reactive to light.  Neck: Normal range of motion and full passive range of motion without pain. Neck supple.  Cardiovascular: Normal rate, regular rhythm and normal heart sounds.  Exam reveals no gallop and no friction rub.   No murmur heard. Pulmonary/Chest: Effort normal and breath sounds normal. No respiratory distress. He has no wheezes. He has no  rhonchi. He has no rales. He exhibits no tenderness and no crepitus.  Abdominal: Soft. Normal appearance and bowel sounds are normal. He exhibits no distension. There is no tenderness. There is no rebound and no guarding.  I do not feel a pulsatile mass in his abdomen.  Musculoskeletal: Normal range of motion. He exhibits no edema or tenderness.  Is very tender to even light palpation of his thighs bilaterally. They do not feel hard. Patient has no dorsalis pedis in his left foot. He's noted to have diffuse swelling of his left foot which his wife states is new. The left foot is paler than the right. However both feet are warm to touch. He has bounding bilateral femoral pulses.  Neurological: He is alert. He has normal strength. No cranial nerve deficit.  Skin: Skin is warm, dry and intact. No rash noted. No erythema. There is pallor.  Psychiatric: He has a normal mood and affect. His speech is normal and behavior is normal. His mood appears not anxious.  Nursing note and vitals reviewed.   ED Course  Procedures (including critical care time)  Medications  0.9 %  sodium chloride infusion ( Intravenous New Bag/Given 11/22/15 0322)  cefTRIAXone (ROCEPHIN) 1 g in dextrose 5 % 50 mL IVPB (not administered)  azithromycin (ZITHROMAX) 500 mg in dextrose 5 % 250 mL IVPB (not administered)  fentaNYL (SUBLIMAZE) injection 25 mcg (25 mcg Intravenous Given 11/22/15 0322)   I was going to get a CT angiogram of the aorta and femoral arteries however patient's creatinine is too high to do a contrast study. Therefore noncontrast CT was ordered to see if there is any obvious disease of his aorta.  4:20 AM I relayed patient's blood test results to his wife who did not seem surprised. Patient is resting comfortably after his pain medicine.  6 AM after reviewing patient's CT scan chest x-ray was ordered to more clearly define his possible pneumonia. Wife was informed. We also discussed his CT results. Patient's  pain is better however he needs some type of vascular study of his legs done. I have ordered arterial ultrasound testing.  6:50 AM patient was given IV Rocephin and IV Zithromax for community-acquired pneumonia  At this point wife states the patient's brother died of an aneurysm several months ago.  07:20 pt is waiting to get his arterial US study, due to holiday may not be until after 9 am.   Pt turned over to Dr Estell Harpin to get his arterial study results. Pt has been pain free since given meds.    Labs Review Results for orders placed or performed during the hospital encounter of 11/22/15  Basic metabolic panel  Result Value Ref Range   Sodium 143 135 - 145 mmol/L   Potassium 4.0 3.5 - 5.1 mmol/L   Chloride 116 (H) 101 - 111 mmol/L   CO2 17 (L) 22 -  32 mmol/L   Glucose, Bld 115 (H) 65 - 99 mg/dL   BUN 50 (H) 6 - 20 mg/dL   Creatinine, Ser 1.61 (H) 0.61 - 1.24 mg/dL   Calcium 9.6 8.9 - 09.6 mg/dL   GFR calc non Af Amer 7 (L) >60 mL/min   GFR calc Af Amer 8 (L) >60 mL/min   Anion gap 10 5 - 15  CBC with Differential  Result Value Ref Range   WBC 6.8 4.0 - 10.5 K/uL   RBC 3.00 (L) 4.22 - 5.81 MIL/uL   Hemoglobin 9.0 (L) 13.0 - 17.0 g/dL   HCT 04.5 (L) 40.9 - 81.1 %   MCV 88.7 78.0 - 100.0 fL   MCH 30.0 26.0 - 34.0 pg   MCHC 33.8 30.0 - 36.0 g/dL   RDW 91.4 78.2 - 95.6 %   Platelets 170 150 - 400 K/uL   Neutrophils Relative % 80 %   Neutro Abs 5.4 1.7 - 7.7 K/uL   Lymphocytes Relative 9 %   Lymphs Abs 0.6 (L) 0.7 - 4.0 K/uL   Monocytes Relative 8 %   Monocytes Absolute 0.5 0.1 - 1.0 K/uL   Eosinophils Relative 3 %   Eosinophils Absolute 0.2 0.0 - 0.7 K/uL   Basophils Relative 0 %   Basophils Absolute 0.0 0.0 - 0.1 K/uL  Lactic acid, plasma  Result Value Ref Range   Lactic Acid, Venous 0.7 0.5 - 2.0 mmol/L  APTT  Result Value Ref Range   aPTT 36 24 - 37 seconds  Protime-INR  Result Value Ref Range   Prothrombin Time 17.0 (H) 11.6 - 15.2 seconds   INR 1.37 0.00 - 1.49   CK  Result Value Ref Range   Total CK 25 (L) 49 - 397 U/L   Laboratory interpretation all normal except anemia, chronic renal failure, compensated metabolic acidosis     Imaging Review Ct Abdomen Pelvis Wo Contrast  11/22/2015  CLINICAL DATA:  Assess known abdominal aortic aneurysm. Subsequent encounter. EXAM: CT ABDOMEN AND PELVIS WITHOUT CONTRAST TECHNIQUE: Multidetector CT imaging of the abdomen and pelvis was performed following the standard protocol without IV contrast. COMPARISON:  CT of the abdomen and pelvis from 08/29/2015 FINDINGS: Dense right basilar airspace opacification is compatible with pneumonia. Scattered vague hypodensities are seen in the liver, measuring up to 1.5 cm in size. These are nonspecific but may reflect cysts. The spleen is unremarkable in appearance. The gallbladder is grossly unremarkable, though difficult to fully assess due to motion artifact. The pancreas and adrenal glands are unremarkable. The exophytic mass arising at the lower pole of the right kidney appears to have increased mildly in size, measuring 5.1 x 4.1 cm. As before, this is concerning for malignancy. Nonspecific perinephric stranding is noted bilaterally. Mild bilateral renal atrophy is seen. There is no evidence of hydronephrosis. No renal or ureteral stones are identified. No free fluid is identified. The small bowel is unremarkable in appearance. The stomach is within normal limits. No acute vascular abnormalities are seen. Relatively diffuse calcification is seen along the abdominal aorta and its branches. There is relatively stable aneurysmal dilatation of the infrarenal abdominal aorta to 3.5 cm in maximal AP dimension. Ectasia is noted along the common iliac arteries bilaterally. The appendix is normal in caliber, without evidence for appendicitis. The colon is partially filled with stool. Scattered diverticulosis is noted along the proximal sigmoid colon, without evidence of diverticulitis. The  bladder is moderately distended; there is suggestion of a right-sided bladder diverticulum.  The prostate is grossly unremarkable in appearance. No inguinal lymphadenopathy is seen. No acute osseous abnormalities are identified. Multilevel disc space narrowing is noted along the lumbar spine, with vacuum phenomenon at L2-L3, and mild grade 1 anterolisthesis of L3 on L4, reflecting underlying facet disease. IMPRESSION: 1. Right basilar pneumonia noted. 2. Exophytic mass at the lower pole the right kidney has increased mildly in size, measuring 5.1 x 4.1 cm, and remains concerning for malignancy. This is being followed by Urology. 3. Scattered vague hypodensities in the liver, measuring up to 1.5 cm in size and grossly stable in appearance. These are nonspecific but may reflect cysts. 4. Relatively diffuse calcification along the abdominal aorta and its branches. Relatively stable aneurysmal dilatation of the infrarenal abdominal aorta to 3.5 cm in maximal AP dimension. Recommend followup by ultrasound in 2 years. This recommendation follows ACR consensus guidelines: White Paper of the ACR Incidental Findings Committee II on Vascular Findings. J Am Coll Radiol 2013; 10:789-794. 5. Scattered diverticulosis along the proximal sigmoid colon, without evidence of diverticulitis. 6. Suggestion of right-sided bladder diverticulum. Bladder otherwise grossly unremarkable. 7. Minimal degenerative change along the lumbar spine. Electronically Signed   By: Roanna RaiderJeffery  Chang M.D.   On: 11/22/2015 05:08   Dg Chest 2 View  11/22/2015  CLINICAL DATA:  Acute onset of dry cough. Concern for pneumonia on CT of the abdomen and pelvis. Initial encounter. EXAM: CHEST  2 VIEW COMPARISON:  CT of the abdomen and pelvis performed earlier today at 4:29 a.m. FINDINGS: Right basilar airspace opacity is compatible with pneumonia, as noted on recent CT. Peribronchial thickening is noted. No pleural effusion or pneumothorax is seen. The heart  remains normal in size. No acute osseous abnormalities are identified. IMPRESSION: Right basilar pneumonia, as noted on recent CT. Peribronchial thickening seen. Electronically Signed   By: Roanna RaiderJeffery  Chang M.D.   On: 11/22/2015 06:30   I have personally reviewed and evaluated these images and lab results as part of my medical decision-making.   EKG Interpretation None      MDM   Final diagnoses:  Leg pain  Nonpalpable pulse  CAP (community acquired pneumonia)     Disposition pending   Devoria AlbeIva Teron Blais, MD, Concha PyoFACEP    Marka Treloar, MD 11/22/15 [phone]

## 2015-11-22 NOTE — ED Notes (Signed)
Report called to Sandra on [address], all questions answered.  

## 2015-11-22 NOTE — ED Notes (Addendum)
Pt reports bilateral upper leg pain since last night, states it began before he went to bed, pain worsens when he lays down, pain not relieved by Tylenol taken at bed time, [facility] denies [facility], pt does have cough, but states, "I have whatever is going around." Right pedal pulse bounding, unable to palpate left pedal pulse, extremity color WNL, extremities are warm. Unable to locate left pedal pulse with doppler but left posterior tibial pulse is present with doppler. States his left toes are "numb," and that began with the bilateral thigh pain that started last night. Wife reports he was on his knees yesterday, cleaning the oven.

## 2015-11-23 DIAGNOSIS — J189 Pneumonia, unspecified organism: Secondary | ICD-10-CM | POA: Diagnosis not present

## 2015-11-23 DIAGNOSIS — M79652 Pain in left thigh: Secondary | ICD-10-CM | POA: Diagnosis not present

## 2015-11-23 DIAGNOSIS — M79651 Pain in right thigh: Secondary | ICD-10-CM | POA: Diagnosis not present

## 2015-11-23 DIAGNOSIS — N185 Chronic kidney disease, stage 5: Secondary | ICD-10-CM | POA: Diagnosis not present

## 2015-11-23 LAB — BASIC METABOLIC PANEL
Anion gap: 6 (ref 5–15)
BUN: 51 mg/dL — AB (ref 6–20)
CHLORIDE: 116 mmol/L — AB (ref 101–111)
CO2: 17 mmol/L — ABNORMAL LOW (ref 22–32)
CREATININE: 5.99 mg/dL — AB (ref 0.61–1.24)
Calcium: 9.3 mg/dL (ref 8.9–10.3)
GFR calc Af Amer: 8 mL/min — ABNORMAL LOW (ref >60)
GFR calc non Af Amer: 7 mL/min — ABNORMAL LOW (ref >60)
GLUCOSE: 145 mg/dL — AB (ref 65–99)
Potassium: 3.8 mmol/L (ref 3.5–5.1)
SODIUM: 139 mmol/L (ref 135–145)

## 2015-11-23 LAB — CBC
HEMATOCRIT: 24.9 % — AB (ref 39.0–52.0)
Hemoglobin: 8.3 g/dL — ABNORMAL LOW (ref 13.0–17.0)
MCH: 30.1 pg (ref 26.0–34.0)
MCHC: 33.3 g/dL (ref 30.0–36.0)
MCV: 90.2 fL (ref 78.0–100.0)
PLATELETS: 154 10*3/uL (ref 150–400)
RBC: 2.76 MIL/uL — ABNORMAL LOW (ref 4.22–5.81)
RDW: 14.7 % (ref 11.5–15.5)
WBC: 7.9 10*3/uL (ref 4.0–10.5)

## 2015-11-23 LAB — STREP PNEUMONIAE URINARY ANTIGEN: STREP PNEUMO URINARY ANTIGEN: NEGATIVE

## 2015-11-23 MED ORDER — AZITHROMYCIN 250 MG PO TABS: 250.0000 mg | ORAL_TABLET | Freq: Every day | ORAL | Status: DC

## 2015-11-23 MED ORDER — CEFUROXIME AXETIL 500 MG PO TABS: 500.0000 mg | ORAL_TABLET | Freq: Every day | ORAL | Status: DC

## 2015-11-23 MED ORDER — CEFUROXIME AXETIL 250 MG PO TABS
500.0000 mg | ORAL_TABLET | Freq: Every day | ORAL | Status: DC
  Administered 2015-11-23: 500 mg via ORAL
  Filled 2015-11-23: qty 2

## 2015-11-23 NOTE — Progress Notes (Signed)
Patient received discharge instructions along prescription information. Patient and wife verbalized understanding of all instructions. Patient was escorted by staff via wheelchair to vehicle. Patient discharged to home in stable condition.

## 2015-11-23 NOTE — Care Management Obs Status (Signed)
MEDICARE OBSERVATION STATUS NOTIFICATION   Patient Details  Name: Brett Perry MRN: [id number] Date of Birth: [birth year]   Medicare Observation Status Notification Given:  Yes    Cheryl FlashBlackwell, Bartosz Luginbill Crowder, RN 11/23/2015, 1:37 PM

## 2015-11-23 NOTE — Discharge Summary (Signed)
Physician Discharge Summary  Brett Perry RUE:[id number] DOB: 1920-10-25 DOA: 11/22/2015  PCP: [name], MD  Admit date: 11/22/2015 Discharge date: 11/23/2015  Recommendations for Outpatient Follow-up:   Follow up with PCP within 1-2 weeks for resolution of pneumonia.  Follow up with urology as scheduled for renal mass.   Follow up CKD Stage V.    Follow-up Information    Follow up with [name], MD. Schedule an appointment as soon as possible for a visit in 1 week.   Specialty:  Family Medicine   Contact information:   [address] [place] [address] [phone]        Discharge Diagnoses:  1. CAP.  2. Bilateral thigh pain. 3. CKD Stage V with chronic metabolic acidosis. 4. Anemia of CKD, chronic disease  Discharge Condition: Improved Disposition: Discharge home  Diet recommendation: Heart healthy   Filed Weights   11/22/15 0234  Weight: 62.143 kg (137 lb)    History of present illness:  45 yom with past medical CAD, HTN, and  renal mass presented with complaints of consistent bilateral thigh pain that onset 11/24 around 0100. Pain is described as muscular, cramping, non-radiating, and severity is 10/10. No relief with Tylenol and heating pads. Complains of toe numbness on left that has been intermittent for the last few weeks. Admits coughing for last three days. Denies fever, sore throat, new rashes, chest pain, SOB, dysuria, cough, nausea, vomiting. CXR in ED revealed right base pneumonia. Admitted for further evaluation and monitoring.   Hospital Course:  CAP rapidly improved with abx, no hypoxia. Thigh pain resolved spontaneously, likely muscular in nature, consider neuropathic. Renal function at baseline as is anemia. Hospitalization was uncomplicated.  Individual issues as below:  1. CAP. Afebrile, lactic acid and WBC WNL. No hypoxia. Doing well. 2. Bilateral thigh pain, normal CK, normal femoral pulses. Resolved. Favor  muscular from work 11/23 or neuropathic. No evidence of ischemia.  3. CKD Stage V with chronic metabolic acidosis, at baseline. Not on HD. BUN 45, creatinine 5.07 June 2015 per Dr. Tawana Scale office.  4. Normocytic anemia, no bleeding noted. Suspect anemia of CKD, chronic disease. Hgb 9.1 in 05/2015 per Dr. Tawana Scale office.   Doing well, no hypoxia, plan home today on oral abx. D/w Lorin Picket, pharmacy; Ceftin 500 daily; Zithromax. Labs at baseline.  Consultations:  None  Procedures:  None  Discharge Instructions Discharge Instructions    Activity as tolerated - No restrictions    Complete by:  As directed      Diet - low sodium heart healthy    Complete by:  As directed      Discharge instructions    Complete by:  As directed   Call your physician or seek immediate medical attention for fever, shortness of breath, pain or worsening of condition.            Discharge Medication List as of 11/23/2015  4:04 PM    START taking these medications   Details  azithromycin (ZITHROMAX) 250 MG tablet Take 1 tablet (250 mg total) by mouth daily. Start 11/26 in AM, Starting 11/23/2015, Until Discontinued, Normal    cefUROXime (CEFTIN) 500 MG tablet Take 1 tablet (500 mg total) by mouth daily., Starting 11/23/2015, Until Discontinued, Normal      CONTINUE these medications which have NOT CHANGED   Details  Besifloxacin HCl 0.6 % SUSP Apply 1 drop to eye 4 (four) times daily. After injection at eye doctor for macular degeneration., Until Discontinued,  Historical Med    docusate sodium (COLACE) 100 MG capsule Take 1 capsule (100 mg total) by mouth every 12 (twelve) hours., Starting 08/17/2015, Until Discontinued, Print    doxazosin (CARDURA) 2 MG tablet Take 2 mg by mouth daily., Until Discontinued, Historical Med    ergocalciferol (VITAMIN D2) 50000 UNITS capsule Take 50,000 Units by mouth once a week., Until Discontinued, Historical Med    Ferrous Sulfate (IRON) 325 (65 FE) MG TABS Take  65 capsules by mouth daily., Until Discontinued, Historical Med    finasteride (PROSCAR) 5 MG tablet Take 5 mg by mouth daily., Until Discontinued, Historical Med    Multiple Vitamin (MULTIVITAMIN) tablet Take 1 tablet by mouth daily., Until Discontinued, Historical Med    Multiple Vitamins-Minerals (ICAPS AREDS 2 PO) Take 2 capsules by mouth daily., Until Discontinued, Historical Med    Omega-3 Fatty Acids (FISH OIL) 1000 MG CAPS Take by mouth., Until Discontinued, Historical Med    sodium bicarbonate 650 MG tablet Take 1,300 mg by mouth daily. , Starting 06/08/2015, Until Discontinued, Historical Med       No Known Allergies  The results of significant diagnostics from this hospitalization (including imaging, microbiology, ancillary and laboratory) are listed below for reference.    Significant Diagnostic Studies: Ct Abdomen Pelvis Wo Contrast  11/22/2015  CLINICAL DATA:  Assess known abdominal aortic aneurysm. Subsequent encounter. EXAM: CT ABDOMEN AND PELVIS WITHOUT CONTRAST TECHNIQUE: Multidetector CT imaging of the abdomen and pelvis was performed following the standard protocol without IV contrast. COMPARISON:  CT of the abdomen and pelvis from 08/29/2015 FINDINGS: Dense right basilar airspace opacification is compatible with pneumonia. Scattered vague hypodensities are seen in the liver, measuring up to 1.5 cm in size. These are nonspecific but may reflect cysts. The spleen is unremarkable in appearance. The gallbladder is grossly unremarkable, though difficult to fully assess due to motion artifact. The pancreas and adrenal glands are unremarkable. The exophytic mass arising at the lower pole of the right kidney appears to have increased mildly in size, measuring 5.1 x 4.1 cm. As before, this is concerning for malignancy. Nonspecific perinephric stranding is noted bilaterally. Mild bilateral renal atrophy is seen. There is no evidence of hydronephrosis. No renal or ureteral stones are  identified. No free fluid is identified. The small bowel is unremarkable in appearance. The stomach is within normal limits. No acute vascular abnormalities are seen. Relatively diffuse calcification is seen along the abdominal aorta and its branches. There is relatively stable aneurysmal dilatation of the infrarenal abdominal aorta to 3.5 cm in maximal AP dimension. Ectasia is noted along the common iliac arteries bilaterally. The appendix is normal in caliber, without evidence for appendicitis. The colon is partially filled with stool. Scattered diverticulosis is noted along the proximal sigmoid colon, without evidence of diverticulitis. The bladder is moderately distended; there is suggestion of a right-sided bladder diverticulum. The prostate is grossly unremarkable in appearance. No inguinal lymphadenopathy is seen. No acute osseous abnormalities are identified. Multilevel disc space narrowing is noted along the lumbar spine, with vacuum phenomenon at L2-L3, and mild grade 1 anterolisthesis of L3 on L4, reflecting underlying facet disease. IMPRESSION: 1. Right basilar pneumonia noted. 2. Exophytic mass at the lower pole the right kidney has increased mildly in size, measuring 5.1 x 4.1 cm, and remains concerning for malignancy. This is being followed by Urology. 3. Scattered vague hypodensities in the liver, measuring up to 1.5 cm in size and grossly stable in appearance. These are nonspecific but may reflect  cysts. 4. Relatively diffuse calcification along the abdominal aorta and its branches. Relatively stable aneurysmal dilatation of the infrarenal abdominal aorta to 3.5 cm in maximal AP dimension. Recommend followup by ultrasound in 2 years. This recommendation follows ACR consensus guidelines: White Paper of the ACR Incidental Findings Committee II on Vascular Findings. J Am Coll Radiol 2013; 10:789-794. 5. Scattered diverticulosis along the proximal sigmoid colon, without evidence of diverticulitis. 6.  Suggestion of right-sided bladder diverticulum. Bladder otherwise grossly unremarkable. 7. Minimal degenerative change along the lumbar spine. Electronically Signed   By: Roanna Raider M.D.   On: 11/22/2015 05:08   Dg Chest 2 View  11/22/2015  CLINICAL DATA:  Acute onset of dry cough. Concern for pneumonia on CT of the abdomen and pelvis. Initial encounter. EXAM: CHEST  2 VIEW COMPARISON:  CT of the abdomen and pelvis performed earlier today at 4:29 a.m. FINDINGS: Right basilar airspace opacity is compatible with pneumonia, as noted on recent CT. Peribronchial thickening is noted. No pleural effusion or pneumothorax is seen. The heart remains normal in size. No acute osseous abnormalities are identified. IMPRESSION: Right basilar pneumonia, as noted on recent CT. Peribronchial thickening seen. Electronically Signed   By: Roanna Raider M.D.   On: 11/22/2015 06:30   US Arterial Seg Single  11/22/2015  CLINICAL DATA:  Intermittent bilateral toe numbness EXAM: NONINVASIVE PHYSIOLOGIC VASCULAR STUDY OF BILATERAL LOWER EXTREMITIES TECHNIQUE: Evaluation of both lower extremities were performed at rest, including calculation of ankle-brachial indices with single level Doppler, pressure and pulse volume recording. COMPARISON:  None. FINDINGS: Right ABI:  1.15 Left ABI:  1.16 Right Lower Extremity: Normal waveforms are noted in the distal vessels. Left Lower Extremity: Slight blunting of the waveforms noted although the ankle brachial index is within normal limits. IMPRESSION: Normal ankle-brachial index bilaterally. Electronically Signed   By: Alcide Clever M.D.   On: 11/22/2015 10:09     Labs: Basic Metabolic Panel:  Recent Labs Lab 11/22/15 0315 11/23/15 0920  NA 143 139  K 4.0 3.8  CL 116* 116*  CO2 17* 17*  GLUCOSE 115* 145*  BUN 50* 51*  CREATININE 6.08* 5.99*  CALCIUM 9.6 9.3   CBC:  Recent Labs Lab 11/22/15 0315 11/23/15 0920  WBC 6.8 7.9  NEUTROABS 5.4  --   HGB 9.0* 8.3*  HCT  26.6* 24.9*  MCV 88.7 90.2  PLT 170 154   Cardiac Enzymes:  Recent Labs Lab 11/22/15 0315  CKTOTAL 25*    Principal Problem:   Pneumonia Active Problems:   CKD (chronic kidney disease), stage V (HCC)   Bilateral thigh pain   CAP (community acquired pneumonia)   Time coordinating discharge: 35 minutes   Signed:  Brendia Sacks, MD [facility] 11/23/2015, 8:23 AM    By signing my name below, I, Zadie Cleverly attest that this documentation has been prepared under the direction and in the presence of Brendia Sacks, MD Electronically signed: Zadie Cleverly  11/23/2015   I personally performed the services described in this documentation. All medical record entries made by the scribe were at my direction. I have reviewed the chart and agree that the record reflects my personal performance and is accurate and complete. Brendia Sacks, MD

## 2015-11-23 NOTE — Progress Notes (Signed)
PROGRESS NOTE  Brett Perry UEA:[id number] DOB: Jan 11, 1920 DOA: 11/22/2015 PCP: Pamelia HoitWILSON,Brett HENRY, MD  Summary: [age] yom with past medical CAD, HTN renal mass presented with complaints of consistent bilateral thigh pain that onset last night around 0100. CXR in ED revealed right base pneumonia.   Assessment/Plan: 1. CAP. Afebrile, lactic acid and WBC WNL. No hypoxia. Doing well. 2. Bilateral thigh pain, normal CK, normal femoral pulses. Resolved. Favor muscular from work 11/23 or neuropathic. No evidence of ischemia.  3. Suspected CKD Stage V with chronic metabolic acidosis, at baseline. Not on HD. BUn 45, creatinine 5.07 June 2015 per Dr. Tawana ScaleWilson's office.  4. Normocytic anemia, no bleeding noted. Suspect anemia of CKD, chronic disease. Hgb 9.1 in 05/2015 per Dr. Tawana ScaleWilson's office.   Doing well, no hypoxia, plan home today on oral abx. D/w Lorin PicketScott, pharmacy; Ceftin 500 daily; Zithromax. Labs at baseline.  Brett Sacksaniel Jyquan Kenley, MD  [facility]  Pager [phone] If 7PM-7AM, please contact night-coverage at [email or web address], password [email or web address] 11/23/2015, 8:14 AM  LOS: 1 day   Consultants:  None   Procedures:  None   Antibiotics:  Ceftriaxone 11/24  Ceftin 11/25 >> 11/30  Azithromycin 11/24 >> 11/28  HPI/Subjective: Feels better. Leg pain resolved. Breathing fine. Wants to go home.  Objective: Filed Vitals:   11/22/15 0900 11/22/15 1205 11/22/15 2112 11/23/15 0617  BP: 97/54 110/43 97/58 114/49  Pulse: 73 66 66 62  Temp:  98 F (36.7 C) 98 F (36.7 C) 98.2 F (36.8 C)  TempSrc:  Oral Oral Oral  Resp:  18 20 20   Height:  5\' 10"  (1.778 m)    Weight:      SpO2: 96% 97% 98% 99%    Intake/Output Summary (Last 24 hours) at 11/23/15 0814 Last data filed at 11/22/15 1730  Gross per 24 hour  Intake    240 ml  Output      0 ml  Net    240 ml     Filed Weights   11/22/15 0234  Weight: 62.143 kg (137 lb)    Exam:  Afebrile, VSS, no hypoxia  General:  Appears calm and  comfortable Cardiovascular: RRR, no m/r/g. No LE edema. Respiratory: CTA generally with few rhonci. No wheezes or rales Skin: left foot warm and dry; perfusion appears normal. Sensation appears normal. Musculoskeletal: moves legs Psychiatric: grossly normal mood and affect, speech fluent and appropriate  New data reviewed:  Hgb 8.3, stable  BUN 51, stable  Creatinine 5.99, stable  CO2 17, stabe  Pertinent data since admission:  US Leg unremarkable.   Scheduled Meds: . azithromycin  500 mg Oral Q24H  . cefTRIAXone (ROCEPHIN)  IV  1 g Intravenous Q24H  . doxazosin  2 mg Oral Daily  . finasteride  5 mg Oral Daily  . gatifloxacin  1 drop Right Eye QID  . sodium bicarbonate  1,300 mg Oral Daily   Continuous Infusions:   Principal Problem:   Pneumonia Active Problems:   CKD (chronic kidney disease), stage V (HCC)   Bilateral thigh pain   CAP (community acquired pneumonia)       By signing my name below, I, Brett Perry attest that this documentation has been prepared under the direction and in the presence of Brett Sacksaniel Turner Kunzman, MD Electronically signed: Zadie CleverlyJessica Perry  11/23/2015 \  I personally performed the services described in this documentation. All medical record entries made by the scribe were at my direction. I have reviewed the chart and agree that the record reflects  my personal performance and is accurate and complete. Brett Hodgkins, MD

## 2015-11-23 NOTE — Care Management Note (Signed)
Case Management Note  Patient Details  Name: Brett Perry MRN: [id number] Date of Birth: 19-Sep-1920  Subjective/Objective:                  Pt admitted from home with pneumonia. Pt lives with his wife and will return home at discharge. Pt uses a cane prn at home but pts wife stated that pt is normally very independent with ADL's.  Action/Plan: Anticipate discharge within 24 hours. No Cm needs noted.  Expected Discharge Date:                  Expected Discharge Plan:  Home/Self Care  In-House Referral:  NA  Discharge planning Services  CM Consult  Post Acute Care Choice:  NA Choice offered to:  NA  DME Arranged:    DME Agency:     HH Arranged:    HH Agency:     Status of Service:  Completed, signed off  Medicare Important Message Given:    Date Medicare IM Given:    Medicare IM give by:    Date Additional Medicare IM Given:    Additional Medicare Important Message give by:     If discussed at Long Length of Stay Meetings, dates discussed:    Additional Comments:  Cheryl FlashBlackwell, Cohl Behrens Crowder, RN 11/23/2015, 1:38 PM

## 2015-11-24 LAB — LEGIONELLA PNEUMOPHILA SEROGP 1 UR AG: L. pneumophila Serogp 1 Ur Ag: NEGATIVE

## 2015-12-21 ENCOUNTER — Encounter (INDEPENDENT_AMBULATORY_CARE_PROVIDER_SITE_OTHER): Payer: Medicare HMO | Admitting: Ophthalmology

## 2015-12-21 DIAGNOSIS — H43813 Vitreous degeneration, bilateral: Secondary | ICD-10-CM

## 2015-12-21 DIAGNOSIS — H353211 Exudative age-related macular degeneration, right eye, with active choroidal neovascularization: Secondary | ICD-10-CM | POA: Diagnosis not present

## 2015-12-21 DIAGNOSIS — H353121 Nonexudative age-related macular degeneration, left eye, early dry stage: Secondary | ICD-10-CM

## 2016-02-15 ENCOUNTER — Encounter (INDEPENDENT_AMBULATORY_CARE_PROVIDER_SITE_OTHER): Payer: Medicare HMO | Admitting: Ophthalmology

## 2016-02-15 DIAGNOSIS — H43813 Vitreous degeneration, bilateral: Secondary | ICD-10-CM | POA: Diagnosis not present

## 2016-02-15 DIAGNOSIS — H353211 Exudative age-related macular degeneration, right eye, with active choroidal neovascularization: Secondary | ICD-10-CM

## 2016-02-15 DIAGNOSIS — H353121 Nonexudative age-related macular degeneration, left eye, early dry stage: Secondary | ICD-10-CM

## 2016-02-26 DIAGNOSIS — H9190 Unspecified hearing loss, unspecified ear: Secondary | ICD-10-CM | POA: Insufficient documentation

## 2016-02-26 DIAGNOSIS — L853 Xerosis cutis: Secondary | ICD-10-CM | POA: Insufficient documentation

## 2016-02-26 DIAGNOSIS — N185 Chronic kidney disease, stage 5: Secondary | ICD-10-CM

## 2016-02-26 DIAGNOSIS — D631 Anemia in chronic kidney disease: Secondary | ICD-10-CM | POA: Insufficient documentation

## 2016-04-25 ENCOUNTER — Encounter (INDEPENDENT_AMBULATORY_CARE_PROVIDER_SITE_OTHER): Payer: Medicare HMO | Admitting: Ophthalmology

## 2016-04-25 DIAGNOSIS — H353211 Exudative age-related macular degeneration, right eye, with active choroidal neovascularization: Secondary | ICD-10-CM

## 2016-04-25 DIAGNOSIS — H43813 Vitreous degeneration, bilateral: Secondary | ICD-10-CM | POA: Diagnosis not present

## 2016-04-25 DIAGNOSIS — H353122 Nonexudative age-related macular degeneration, left eye, intermediate dry stage: Secondary | ICD-10-CM | POA: Diagnosis not present

## 2016-07-02 DIAGNOSIS — N185 Chronic kidney disease, stage 5: Secondary | ICD-10-CM

## 2016-07-02 DIAGNOSIS — I120 Hypertensive chronic kidney disease with stage 5 chronic kidney disease or end stage renal disease: Secondary | ICD-10-CM | POA: Insufficient documentation

## 2016-07-18 ENCOUNTER — Encounter (INDEPENDENT_AMBULATORY_CARE_PROVIDER_SITE_OTHER): Payer: Medicare HMO | Admitting: Ophthalmology

## 2016-07-18 DIAGNOSIS — H353112 Nonexudative age-related macular degeneration, right eye, intermediate dry stage: Secondary | ICD-10-CM

## 2016-07-18 DIAGNOSIS — H353211 Exudative age-related macular degeneration, right eye, with active choroidal neovascularization: Secondary | ICD-10-CM | POA: Diagnosis not present

## 2016-07-18 DIAGNOSIS — H43813 Vitreous degeneration, bilateral: Secondary | ICD-10-CM | POA: Diagnosis not present

## 2016-10-31 ENCOUNTER — Encounter (INDEPENDENT_AMBULATORY_CARE_PROVIDER_SITE_OTHER): Payer: Medicare HMO | Admitting: Ophthalmology

## 2016-10-31 DIAGNOSIS — H353211 Exudative age-related macular degeneration, right eye, with active choroidal neovascularization: Secondary | ICD-10-CM | POA: Diagnosis not present

## 2016-10-31 DIAGNOSIS — H353121 Nonexudative age-related macular degeneration, left eye, early dry stage: Secondary | ICD-10-CM

## 2016-10-31 DIAGNOSIS — H43813 Vitreous degeneration, bilateral: Secondary | ICD-10-CM | POA: Diagnosis not present

## 2016-12-31 ENCOUNTER — Encounter: Payer: Self-pay | Admitting: Interventional Radiology

## 2017-02-27 ENCOUNTER — Encounter (INDEPENDENT_AMBULATORY_CARE_PROVIDER_SITE_OTHER): Payer: Medicare HMO | Admitting: Ophthalmology

## 2017-02-27 DIAGNOSIS — H353122 Nonexudative age-related macular degeneration, left eye, intermediate dry stage: Secondary | ICD-10-CM | POA: Diagnosis not present

## 2017-02-27 DIAGNOSIS — H353211 Exudative age-related macular degeneration, right eye, with active choroidal neovascularization: Secondary | ICD-10-CM | POA: Diagnosis not present

## 2017-02-27 DIAGNOSIS — H43813 Vitreous degeneration, bilateral: Secondary | ICD-10-CM

## 2017-04-13 ENCOUNTER — Encounter (HOSPITAL_COMMUNITY): Payer: Self-pay | Admitting: Emergency Medicine

## 2017-04-13 ENCOUNTER — Observation Stay (HOSPITAL_COMMUNITY)
Admission: EM | Admit: 2017-04-13 | Discharge: 2017-04-16 | Disposition: A | Discharge status: Hospice | Payer: Medicare HMO | Attending: Internal Medicine | Admitting: Internal Medicine

## 2017-04-13 ENCOUNTER — Ambulatory Visit (INDEPENDENT_AMBULATORY_CARE_PROVIDER_SITE_OTHER): Payer: Medicare PPO | Admitting: Otolaryngology

## 2017-04-13 ENCOUNTER — Emergency Department (HOSPITAL_COMMUNITY): Payer: Medicare HMO

## 2017-04-13 DIAGNOSIS — I120 Hypertensive chronic kidney disease with stage 5 chronic kidney disease or end stage renal disease: Secondary | ICD-10-CM | POA: Insufficient documentation

## 2017-04-13 DIAGNOSIS — R310 Gross hematuria: Secondary | ICD-10-CM

## 2017-04-13 DIAGNOSIS — K922 Gastrointestinal hemorrhage, unspecified: Principal | ICD-10-CM | POA: Insufficient documentation

## 2017-04-13 DIAGNOSIS — J189 Pneumonia, unspecified organism: Secondary | ICD-10-CM | POA: Diagnosis present

## 2017-04-13 DIAGNOSIS — Z515 Encounter for palliative care: Secondary | ICD-10-CM | POA: Diagnosis not present

## 2017-04-13 DIAGNOSIS — K625 Hemorrhage of anus and rectum: Secondary | ICD-10-CM

## 2017-04-13 DIAGNOSIS — N185 Chronic kidney disease, stage 5: Secondary | ICD-10-CM | POA: Insufficient documentation

## 2017-04-13 DIAGNOSIS — N2889 Other specified disorders of kidney and ureter: Secondary | ICD-10-CM | POA: Diagnosis not present

## 2017-04-13 DIAGNOSIS — Z66 Do not resuscitate: Secondary | ICD-10-CM | POA: Insufficient documentation

## 2017-04-13 DIAGNOSIS — Z79899 Other long term (current) drug therapy: Secondary | ICD-10-CM | POA: Insufficient documentation

## 2017-04-13 DIAGNOSIS — J181 Lobar pneumonia, unspecified organism: Secondary | ICD-10-CM | POA: Diagnosis not present

## 2017-04-13 DIAGNOSIS — D631 Anemia in chronic kidney disease: Secondary | ICD-10-CM | POA: Insufficient documentation

## 2017-04-13 DIAGNOSIS — R319 Hematuria, unspecified: Secondary | ICD-10-CM | POA: Insufficient documentation

## 2017-04-13 DIAGNOSIS — Z7189 Other specified counseling: Secondary | ICD-10-CM | POA: Diagnosis not present

## 2017-04-13 DIAGNOSIS — G4700 Insomnia, unspecified: Secondary | ICD-10-CM | POA: Diagnosis not present

## 2017-04-13 LAB — COMPREHENSIVE METABOLIC PANEL
ALK PHOS: 42 U/L (ref 38–126)
ALT: 10 U/L — ABNORMAL LOW (ref 17–63)
ANION GAP: 10 (ref 5–15)
AST: 17 U/L (ref 15–41)
Albumin: 3.2 g/dL — ABNORMAL LOW (ref 3.5–5.0)
BUN: 50 mg/dL — ABNORMAL HIGH (ref 6–20)
CALCIUM: 9.6 mg/dL (ref 8.9–10.3)
CHLORIDE: 117 mmol/L — AB (ref 101–111)
CO2: 13 mmol/L — AB (ref 22–32)
Creatinine, Ser: 6.66 mg/dL — ABNORMAL HIGH (ref 0.61–1.24)
GFR calc non Af Amer: 6 mL/min — ABNORMAL LOW (ref >60)
GFR, EST AFRICAN AMERICAN: 7 mL/min — AB (ref >60)
Glucose, Bld: 102 mg/dL — ABNORMAL HIGH (ref 65–99)
Potassium: 4.5 mmol/L (ref 3.5–5.1)
Sodium: 140 mmol/L (ref 135–145)
Total Bilirubin: 0.6 mg/dL (ref 0.3–1.2)
Total Protein: 6.4 g/dL — ABNORMAL LOW (ref 6.5–8.1)

## 2017-04-13 LAB — CBC
HCT: 23.1 % — ABNORMAL LOW (ref 39.0–52.0)
HEMOGLOBIN: 7.4 g/dL — AB (ref 13.0–17.0)
MCH: 27.9 pg (ref 26.0–34.0)
MCHC: 32 g/dL (ref 30.0–36.0)
MCV: 87.2 fL (ref 78.0–100.0)
Platelets: 169 10*3/uL (ref 150–400)
RBC: 2.65 MIL/uL — AB (ref 4.22–5.81)
RDW: 14.6 % (ref 11.5–15.5)
WBC: 9.6 10*3/uL (ref 4.0–10.5)

## 2017-04-13 LAB — I-STAT CHEM 8, ED
BUN: 46 mg/dL — ABNORMAL HIGH (ref 6–20)
CALCIUM ION: 1.34 mmol/L (ref 1.15–1.40)
CREATININE: 7.7 mg/dL — AB (ref 0.61–1.24)
Chloride: 123 mmol/L — ABNORMAL HIGH (ref 101–111)
Glucose, Bld: 108 mg/dL — ABNORMAL HIGH (ref 65–99)
HCT: 21 % — ABNORMAL LOW (ref 39.0–52.0)
HEMOGLOBIN: 7.1 g/dL — AB (ref 13.0–17.0)
POTASSIUM: 4.5 mmol/L (ref 3.5–5.1)
Sodium: 141 mmol/L (ref 135–145)
TCO2: 15 mmol/L (ref 0–100)

## 2017-04-13 LAB — POC OCCULT BLOOD, ED: FECAL OCCULT BLD: POSITIVE — AB

## 2017-04-13 LAB — ABO/RH: ABO/RH(D): B POS

## 2017-04-13 LAB — PREPARE RBC (CROSSMATCH)

## 2017-04-13 MED ORDER — ALBUTEROL SULFATE (2.5 MG/3ML) 0.083% IN NEBU: 2.5000 mg | INHALATION_SOLUTION | Freq: Four times a day (QID) | RESPIRATORY_TRACT | Status: DC | PRN

## 2017-04-13 MED ORDER — HYDROCODONE-ACETAMINOPHEN 5-325 MG PO TABS: 1.0000 | ORAL_TABLET | ORAL | Status: DC | PRN

## 2017-04-13 MED ORDER — DEXTROSE 5 % IV SOLN
500.0000 mg | Freq: Once | INTRAVENOUS | Status: AC
  Administered 2017-04-13: 500 mg via INTRAVENOUS
  Filled 2017-04-13: qty 500

## 2017-04-13 MED ORDER — DEXTROSE 5 % IV SOLN
500.0000 mg | INTRAVENOUS | Status: DC
  Administered 2017-04-14 – 2017-04-15 (×2): 500 mg via INTRAVENOUS
  Filled 2017-04-13 (×3): qty 500

## 2017-04-13 MED ORDER — DOXAZOSIN MESYLATE 2 MG PO TABS
2.0000 mg | ORAL_TABLET | Freq: Every day | ORAL | Status: DC
  Administered 2017-04-13 – 2017-04-15 (×3): 2 mg via ORAL
  Filled 2017-04-13 (×4): qty 1

## 2017-04-13 MED ORDER — FINASTERIDE 5 MG PO TABS
5.0000 mg | ORAL_TABLET | Freq: Every day | ORAL | Status: DC
  Administered 2017-04-13 – 2017-04-16 (×4): 5 mg via ORAL
  Filled 2017-04-13 (×4): qty 1

## 2017-04-13 MED ORDER — SODIUM CHLORIDE 0.9% FLUSH
3.0000 mL | Freq: Two times a day (BID) | INTRAVENOUS | Status: DC
  Administered 2017-04-13 – 2017-04-16 (×5): 3 mL via INTRAVENOUS

## 2017-04-13 MED ORDER — DOXAZOSIN MESYLATE 2 MG PO TABS
2.0000 mg | ORAL_TABLET | Freq: Every day | ORAL | Status: DC
Start: 2017-04-13 — End: 2017-04-13
  Filled 2017-04-13: qty 1

## 2017-04-13 MED ORDER — ACETAMINOPHEN 650 MG RE SUPP
650.0000 mg | Freq: Four times a day (QID) | RECTAL | Status: DC | PRN
Start: 2017-04-13 — End: 2017-04-16

## 2017-04-13 MED ORDER — SENNOSIDES-DOCUSATE SODIUM 8.6-50 MG PO TABS: 1.0000 | ORAL_TABLET | Freq: Every evening | ORAL | Status: DC | PRN

## 2017-04-13 MED ORDER — GUAIFENESIN ER 600 MG PO TB12: 600.0000 mg | ORAL_TABLET | Freq: Two times a day (BID) | ORAL | Status: DC | PRN

## 2017-04-13 MED ORDER — BISACODYL 10 MG RE SUPP: 10.0000 mg | Freq: Every day | RECTAL | Status: DC | PRN

## 2017-04-13 MED ORDER — MAGNESIUM CITRATE PO SOLN: 1.0000 | Freq: Once | ORAL | Status: DC | PRN

## 2017-04-13 MED ORDER — ONDANSETRON HCL 4 MG/2ML IJ SOLN: 4.0000 mg | Freq: Four times a day (QID) | INTRAMUSCULAR | Status: DC | PRN

## 2017-04-13 MED ORDER — ONDANSETRON HCL 4 MG PO TABS
4.0000 mg | ORAL_TABLET | Freq: Four times a day (QID) | ORAL | Status: DC | PRN
Start: 2017-04-13 — End: 2017-04-16

## 2017-04-13 MED ORDER — FERROUS SULFATE 325 (65 FE) MG PO TABS
ORAL_TABLET | Freq: Every day | ORAL | Status: DC
  Administered 2017-04-13 – 2017-04-16 (×4): 325 mg via ORAL
  Filled 2017-04-13 (×4): qty 1

## 2017-04-13 MED ORDER — DEXTROSE 5 % IV SOLN
1.0000 g | Freq: Once | INTRAVENOUS | Status: AC
  Administered 2017-04-13: 1 g via INTRAVENOUS
  Filled 2017-04-13: qty 10

## 2017-04-13 MED ORDER — MIRTAZAPINE 15 MG PO TABS
15.0000 mg | ORAL_TABLET | Freq: Every day | ORAL | Status: DC
  Administered 2017-04-13 – 2017-04-15 (×3): 15 mg via ORAL
  Filled 2017-04-13 (×4): qty 1

## 2017-04-13 MED ORDER — DEXTROSE 5 % IV SOLN
1.0000 g | INTRAVENOUS | Status: DC
  Administered 2017-04-14 – 2017-04-15 (×2): 1 g via INTRAVENOUS
  Filled 2017-04-13 (×3): qty 10

## 2017-04-13 MED ORDER — SODIUM CHLORIDE 0.9 % IV SOLN: Freq: Once | INTRAVENOUS | Status: DC

## 2017-04-13 MED ORDER — ACETAMINOPHEN 325 MG PO TABS: 650.0000 mg | ORAL_TABLET | Freq: Four times a day (QID) | ORAL | Status: DC | PRN

## 2017-04-13 NOTE — ED Notes (Signed)
Consent at bedside.  

## 2017-04-13 NOTE — ED Provider Notes (Signed)
[facility]ENCY DEPT Provider Note   CSN: [id number] Arrival date & time: 04/13/17  7829     History   Chief Complaint Chief Complaint  Patient presents with  . Rectal Bleeding    HPI Brett Perry is a 83 y.o. male.  83 yo M with a chief complaint of bright red blood per rectum. This been going on for the past couple weeks. He thinks worsening in intensity. Notices it on the toilet paper and in the bowl. Denies dark stool denies abdominal pain. Denies fevers. Denies rectal pain. Denies chest pain shortness of breath weakness.   The history is provided by the patient.  Rectal Bleeding  Quality:  Bright red Amount:  Scant Duration:  2 weeks Timing:  Constant Chronicity:  Recurrent Context: not anal fissures and not hemorrhoids   Similar prior episodes: no   Relieved by:  Nothing Worsened by:  Wiping and defecation Ineffective treatments:  None tried Associated symptoms: no abdominal pain, no fever and no vomiting     Past Medical History:  Diagnosis Date  . Anosmia   . CKD (chronic kidney disease)   . COPD (chronic obstructive pulmonary disease) (HCC)    pt and wife deny  . Dermatitis   . Hearing loss   . Hypertension    Benign Essential Hypertension  . Macular degeneration   . Proteinuria   . Right kidney mass     Patient Active Problem List   Diagnosis Date Noted  . Pneumonia 11/22/2015  . CKD (chronic kidney disease), stage V (HCC) 11/22/2015  . Bilateral thigh pain 11/22/2015  . CAP (community acquired pneumonia) 11/22/2015  . Right renal mass     Past Surgical History:  Procedure Laterality Date  . HERNIA REPAIR    . IR GENERIC HISTORICAL  02/08/2015   IR RADIOLOGIST EVAL & MGMT 02/08/2015 Berdine Dance, MD [facility] INTERV RAD  . MIDDLE EAR SURGERY         Home Medications    Prior to Admission medications   Medication Sig Start Date End Date Taking? Authorizing Provider  Besifloxacin HCl 0.6 % SUSP Apply 1 drop to eye 4 (four) times daily.  After injection at eye doctor for macular degeneration.   Yes Historical Provider, MD  doxazosin (CARDURA) 2 MG tablet Take 2 mg by mouth daily.   Yes Historical Provider, MD  ergocalciferol (VITAMIN D2) 50000 UNITS capsule Take 50,000 Units by mouth once a week.   Yes Historical Provider, MD  Ferrous Sulfate (IRON) 325 (65 FE) MG TABS Take 65 capsules by mouth daily.   Yes Historical Provider, MD  finasteride (PROSCAR) 5 MG tablet Take 5 mg by mouth daily.   Yes Historical Provider, MD  mirtazapine (REMERON) 15 MG tablet Take 15 mg by mouth at bedtime.   Yes Historical Provider, MD  Multiple Vitamin (MULTIVITAMIN) tablet Take 1 tablet by mouth daily.   Yes Historical Provider, MD  Multiple Vitamins-Minerals (ICAPS AREDS 2 PO) Take 2 capsules by mouth daily.   Yes Historical Provider, MD  Omega-3 Fatty Acids (FISH OIL) 1000 MG CAPS Take by mouth.   Yes Historical Provider, MD  sodium bicarbonate 650 MG tablet Take 650 mg by mouth 3 (three) times daily.  06/08/15  Yes Historical Provider, MD  azithromycin (ZITHROMAX) 250 MG tablet Take 1 tablet (250 mg total) by mouth daily. Start 11/26 in AM Patient not taking: Reported on 04/13/2017 11/23/15   Standley Brooking, MD  cefUROXime (CEFTIN) 500 MG tablet Take 1 tablet (  500 mg total) by mouth daily. Patient not taking: Reported on 04/13/2017 11/23/15   Standley Brooking, MD  docusate sodium (COLACE) 100 MG capsule Take 1 capsule (100 mg total) by mouth every 12 (twelve) hours. Patient not taking: Reported on 04/13/2017 08/17/15   Blane Ohara, MD    Family History Family History  Problem Relation Age of Onset  . Aneurysm Brother     Social History Social History  Substance Use Topics  . Smoking status: Never Smoker  . Smokeless tobacco: Never Used  . Alcohol use No     Allergies   Patient has no known allergies.   Review of Systems Review of Systems  Constitutional: Negative for chills and fever.  HENT: Negative for congestion and  facial swelling.   Eyes: Negative for discharge and visual disturbance.  Respiratory: Negative for shortness of breath.   Cardiovascular: Negative for chest pain and palpitations.  Gastrointestinal: Positive for blood in stool and hematochezia. Negative for abdominal pain, diarrhea and vomiting.  Musculoskeletal: Negative for arthralgias and myalgias.  Skin: Negative for color change and rash.  Neurological: Negative for tremors, syncope and headaches.  Psychiatric/Behavioral: Negative for confusion and dysphoric mood.     Physical Exam Updated Vital Signs BP (!) 125/53   Pulse (!) 56   Temp 98 F (36.7 C) (Oral)   Resp 15   SpO2 95%   Physical Exam  Constitutional: He is oriented to person, place, and time. He appears well-developed and well-nourished.  HENT:  Head: Normocephalic and atraumatic.  Eyes: EOM are normal. Pupils are equal, round, and reactive to light.  Neck: Normal range of motion. Neck supple. No JVD present.  Cardiovascular: Normal rate and regular rhythm.  Exam reveals no gallop and no friction rub.   No murmur heard. Pulmonary/Chest: No respiratory distress. He has no wheezes.  Abdominal: He exhibits no distension and no mass. There is no tenderness. There is no rebound and no guarding.  Genitourinary:  Genitourinary Comments: Mild internal hemorrhoid without active bleeding.  Trace red specs in yellowish stool  Musculoskeletal: Normal range of motion.  Neurological: He is alert and oriented to person, place, and time.  Skin: No rash noted. No pallor.  Psychiatric: He has a normal mood and affect. His behavior is normal.  Nursing note and vitals reviewed.    ED Treatments / Results  Labs (all labs ordered are listed, but only abnormal results are displayed) Labs Reviewed  COMPREHENSIVE METABOLIC PANEL - Abnormal; Notable for the following:       Result Value   Chloride 117 (*)    CO2 13 (*)    Glucose, Bld 102 (*)    BUN 50 (*)    Creatinine, Ser  6.66 (*)    Total Protein 6.4 (*)    Albumin 3.2 (*)    ALT 10 (*)    GFR calc non Af Amer 6 (*)    GFR calc Af Amer 7 (*)    All other components within normal limits  CBC - Abnormal; Notable for the following:    RBC 2.65 (*)    Hemoglobin 7.4 (*)    HCT 23.1 (*)    All other components within normal limits  POC OCCULT BLOOD, ED - Abnormal; Notable for the following:    Fecal Occult Bld POSITIVE (*)    All other components within normal limits  I-STAT CHEM 8, ED - Abnormal; Notable for the following:    Chloride 123 (*)  BUN 46 (*)    Creatinine, Ser 7.70 (*)    Glucose, Bld 108 (*)    Hemoglobin 7.1 (*)    HCT 21.0 (*)    All other components within normal limits  CULTURE, BLOOD (ROUTINE X 2)  CULTURE, BLOOD (ROUTINE X 2)  TYPE AND SCREEN  ABO/RH    EKG  EKG Interpretation None       Radiology Dg Chest 2 View  Result Date: 04/13/2017 CLINICAL DATA:  Pt arrives with wife from home with c/o of rectal bleeding with BM's for the past 2 weeks. Pt states he has been having about 3 episodes of bleeding each day for the last 3 days with each BM. EXAM: CHEST  2 VIEW COMPARISON:  11/30/2015 FINDINGS: There is mild left basilar airspace disease on the lateral view which may reflect atelectasis versus pneumonia. There is no other focal parenchymal opacity. There is no pleural effusion or pneumothorax. The heart and mediastinal contours are unremarkable. The osseous structures are unremarkable. IMPRESSION: Mild left basilar airspace disease on the lateral view which may reflect atelectasis versus pneumonia. Electronically Signed   By: Elige Ko   On: 04/13/2017 10:02    Procedures Procedures (including critical care time)  Medications Ordered in ED Medications  cefTRIAXone (ROCEPHIN) 1 g in dextrose 5 % 50 mL IVPB (1 g Intravenous New Bag/Given 04/13/17 1229)  azithromycin (ZITHROMAX) 500 mg in dextrose 5 % 250 mL IVPB (not administered)     Initial Impression /  Assessment and Plan / ED Course  I have reviewed the triage vital signs and the nursing notes.  Pertinent labs & imaging results that were available during my care of the patient were reviewed by me and considered in my medical decision making (see chart for details).     83 yo M with a cc of BRBPR.  Going on for 2 weeks.  No other symptoms.  Rectal exam with trace red mixed in yellowish stool.  Patient chronically ill appearing.  No abdominal pain.   HGB 7.6.  Mildly lower than his previous. This may be due to his anemia from his chronic kidney disease. Chest x-ray with atelectasis versus pneumonia. With this cough I'll treat with me acquired pneumonia antibiotics. Admit. The patients results and plan were reviewed and discussed.   Any x-rays performed were independently reviewed by myself.   Differential diagnosis were considered with the presenting HPI.  Medications  cefTRIAXone (ROCEPHIN) 1 g in dextrose 5 % 50 mL IVPB (1 g Intravenous New Bag/Given 04/13/17 1229)  azithromycin (ZITHROMAX) 500 mg in dextrose 5 % 250 mL IVPB (not administered)    Vitals:   04/13/17 0930 04/13/17 1000 04/13/17 1015 04/13/17 1030  BP: 118/61 117/62 (!) 117/58 (!) 125/53  Pulse: 67 (!) 58 60 (!) 56  Resp: Temp:      TempSrc:      SpO2: 95%  95% 95%    Final diagnoses:  Rectal bleeding  Community acquired pneumonia of right lower lobe of lung (HCC)    Admission/ observation were discussed with the admitting physician, patient and/or family and they are comfortable with the plan.    Final Clinical Impressions(s) / ED Diagnoses   Final diagnoses:  Rectal bleeding  Community acquired pneumonia of right lower lobe of lung [facility])    New Prescriptions New Prescriptions   No medications on file     Melene Plan, DO 04/13/17 1237

## 2017-04-13 NOTE — ED Notes (Signed)
Admitting at bedside 

## 2017-04-13 NOTE — ED Notes (Signed)
Report attempted  No body answered the phone once put on hold

## 2017-04-13 NOTE — Progress Notes (Signed)
Pt admitted to the unit at 1740. Pt mental status is A&O x 4. Pt oriented to room, staff, and call bell. Skin is intact. Full assessment charted in [facility]. Call bell within reach. Visitor guidelines reviewed w/ pt and/or family.

## 2017-04-13 NOTE — ED Notes (Signed)
Report given.

## 2017-04-13 NOTE — ED Triage Notes (Signed)
Pt arrives with wife from home with c/o of rectal bleeding with BM's for the past 2 weeks. Pt states he has been having about 3 episodes of bleeding each day for the last 3 days with each BM. Pt denies any pain. Pt is alert and ox4. Pt appears pale in triage.

## 2017-04-13 NOTE — H&P (Signed)
History and Physical    BOWIE DELIA ZOX:[id number] DOB: [birth year] DOA: 04/13/2017   PCP: [name], MD   Patient coming from:  Home    Chief Complaint: GI bleed and Hematuria  HPI: Brett Perry is a 83 y.o. male with a history of CKD Stage 5, Known Renal mass suspicious for renal cel carcinoma  never on treatment, COPD, HTN, history of Internet hemorrhoids, presenting with progressive, worsening bright blood per rectum, worse over the last 2 to 3 days, as well as hematuria. The patient reports about 3 episodes of this bleeding issues a day. Of note, the patient never had a colonoscopy in the past, history is mostly provided by wife and daughter, due to patients the ability and hard of hearing. No recent food poisoning, or recent travels. Appetite is normal. He denies any abdominal pain nausea or vomiting. She denies any pelvic pain. He is still producing urinating, but this is decreased. Denies fevers, chills, night sweats  chest pain or palpitations. Denies lower extremity swelling  Denies any other bleeding issues such as epistaxis, hematemesis. Denies sick contacts.   ED Course:  BP (!) 124/59   Pulse 70   Temp 98.3 F (36.8 C) (Oral)   Resp 19   SpO2 98%    NA 141 potassium 4.5  BUN 46 creatinine 7.7 GFR 6  WBC 9.6 hemoglobin 7.4 then with repeat 7.1 with hematocrit 21 platelets 169 urine is pending Mild left basilar airspace disease on the lateral view which may reflect atelectasis versus pneumonia.  Review of Systems: As per HPI otherwise 10 point review of systems negative.   Past Medical History:  Diagnosis Date  . Anosmia   . CKD (chronic kidney disease)   . COPD (chronic obstructive pulmonary disease) (HCC)    pt and wife deny  . Dermatitis   . Hearing loss   . Hypertension    Benign Essential Hypertension  . Macular degeneration   . Proteinuria   . Right kidney mass     Past Surgical History:  Procedure Laterality Date  . HERNIA REPAIR    . IR  GENERIC HISTORICAL  02/08/2015   IR RADIOLOGIST EVAL & MGMT 02/08/2015 Berdine Dance, MD [facility] INTERV RAD  . MIDDLE EAR SURGERY      Social History Social History   Social History  . Marital status: Married    Spouse name: N/A  . Number of children: N/A  . Years of education: N/A   Occupational History  . Not on file.   Social History Main Topics  . Smoking status: Never Smoker  . Smokeless tobacco: Never Used  . Alcohol use No  . Drug use: No  . Sexual activity: Not on file   Other Topics Concern  . Not on file   Social History Narrative  . No narrative on file     No Known Allergies  Family History  Problem Relation Age of Onset  . Aneurysm Brother       Prior to Admission medications   Medication Sig Start Date End Date Taking? Authorizing Provider  Besifloxacin HCl 0.6 % SUSP Apply 1 drop to eye 4 (four) times daily. After injection at eye doctor for macular degeneration.   Yes Historical Provider, MD  doxazosin (CARDURA) 2 MG tablet Take 2 mg by mouth daily.   Yes Historical Provider, MD  ergocalciferol (VITAMIN D2) 50000 UNITS capsule Take 50,000 Units by mouth once a week.   Yes Historical Provider, MD  Ferrous  Sulfate (IRON) 325 (65 FE) MG TABS Take 65 capsules by mouth daily.   Yes Historical Provider, MD  finasteride (PROSCAR) 5 MG tablet Take 5 mg by mouth daily.   Yes Historical Provider, MD  mirtazapine (REMERON) 15 MG tablet Take 15 mg by mouth at bedtime.   Yes Historical Provider, MD  Multiple Vitamin (MULTIVITAMIN) tablet Take 1 tablet by mouth daily.   Yes Historical Provider, MD  Multiple Vitamins-Minerals (ICAPS AREDS 2 PO) Take 2 capsules by mouth daily.   Yes Historical Provider, MD  Omega-3 Fatty Acids (FISH OIL) 1000 MG CAPS Take by mouth.   Yes Historical Provider, MD  sodium bicarbonate 650 MG tablet Take 650 mg by mouth 3 (three) times daily.  06/08/15  Yes Historical Provider, MD  azithromycin (ZITHROMAX) 250 MG tablet Take 1 tablet (250  mg total) by mouth daily. Start 11/26 in AM Patient not taking: Reported on 04/13/2017 11/23/15   Standley Brooking, MD  cefUROXime (CEFTIN) 500 MG tablet Take 1 tablet (500 mg total) by mouth daily. Patient not taking: Reported on 04/13/2017 11/23/15   Standley Brooking, MD  docusate sodium (COLACE) 100 MG capsule Take 1 capsule (100 mg total) by mouth every 12 (twelve) hours. Patient not taking: Reported on 04/13/2017 08/17/15   Blane Ohara, MD    Physical Exam:  Vitals:   04/13/17 1130 04/13/17 1215 04/13/17 1420 04/13/17 1439  BP: 124/60 (!) 123/57 (!) 125/54 (!) 124/59  Pulse: (!) 57 60 70 70  Resp: Temp:   98.9 F (37.2 C) 98.3 F (36.8 C)  TempSrc:    Oral  SpO2: 98% 95% 100% 98%   Constitutional: NAD, calm, comfortable. Chronically appearing. HOH   Eyes: PERRL, lids and conjunctivae normal. Known macular degeneration  ENMT: Mucous membranes are moist, without exudate or lesions  Neck: normal, supple, no masses, no thyromegaly Respiratory: no wheezing, no crackles, rhonchi audible bilaterally, LLL decreased breath sounds . Normal respiratory effort  Cardiovascular: Regular rate and rhythm, no murmurs, rubs or gallops. No extremity edema. 2+ pedal pulses. No carotid bruits.  Abdomen: Soft, non tender, No hepatosplenomegaly. Bowel sounds positive.  Musculoskeletal: no clubbing / cyanosis. Moves all extremities Skin: no jaundice, Several keratotic areas noted in scalp Neurologic: Sensation intact  Strength equal in all extremities     Labs on Admission: I have personally reviewed following labs and imaging studies  CBC:  Recent Labs Lab 04/13/17 0935 04/13/17 0947  WBC 9.6  --   HGB 7.4* 7.1*  HCT 23.1* 21.0*  MCV 87.2  --   PLT 169  --     Basic Metabolic Panel:  Recent Labs Lab 04/13/17 0935 04/13/17 0947  NA 140 141  K 4.5 4.5  CL 117* 123*  CO2 13*  --   GLUCOSE 102* 108*  BUN 50* 46*  CREATININE 6.66* 7.70*  CALCIUM 9.6  --      GFR: CrCl cannot be calculated (Unknown ideal weight.).  Liver Function Tests:  Recent Labs Lab 04/13/17 0935  AST 17  ALT 10*  ALKPHOS 42  BILITOT 0.6  PROT 6.4*  ALBUMIN 3.2*   No results for input(s): LIPASE, AMYLASE in the last 168 hours. No results for input(s): AMMONIA in the last 168 hours.  Coagulation Profile: No results for input(s): INR, PROTIME in the last 168 hours.  Cardiac Enzymes: No results for input(s): CKTOTAL, CKMB, CKMBINDEX, TROPONINI in the last 168 hours.  BNP (last 3 results) No results  for input(s): PROBNP in the last 8760 hours.  HbA1C: No results for input(s): HGBA1C in the last 72 hours.  CBG: No results for input(s): GLUCAP in the last 168 hours.  Lipid Profile: No results for input(s): CHOL, HDL, LDLCALC, TRIG, CHOLHDL, LDLDIRECT in the last 72 hours.  Thyroid Function Tests: No results for input(s): TSH, T4TOTAL, FREET4, T3FREE, THYROIDAB in the last 72 hours.  Anemia Panel: No results for input(s): VITAMINB12, FOLATE, FERRITIN, TIBC, IRON, RETICCTPCT in the last 72 hours.  Urine analysis:    Component Value Date/Time   COLORURINE ORANGE BIOCHEMICALS MAY BE AFFECTED BY COLOR (A) 12/09/2007 1902   APPEARANCEUR TURBID (A) 12/09/2007 1902   LABSPEC 1.026 12/09/2007 1902   PHURINE 5.5 12/09/2007 1902   GLUCOSEU NEGATIVE 12/09/2007 1902   HGBUR LARGE (A) 12/09/2007 1902   BILIRUBINUR SMALL (A) 12/09/2007 1902   KETONESUR 15 (A) 12/09/2007 1902   PROTEINUR >300 (A) 12/09/2007 1902   UROBILINOGEN 0.2 12/09/2007 1902   NITRITE POSITIVE (A) 12/09/2007 1902   LEUKOCYTESUR LARGE (A) 12/09/2007 1902    Sepsis Labs: (procalcitonin:4,lacticidven:4) )No results found for this or any previous visit (from the past 240 hour(s)).   Radiological Exams on Admission: Dg Chest 2 View  Result Date: 04/13/2017 CLINICAL DATA:  Pt arrives with wife from home with c/o of rectal bleeding with BM's for the past 2 weeks. Pt states he  has been having about 3 episodes of bleeding each day for the last 3 days with each BM. EXAM: CHEST  2 VIEW COMPARISON:  11/30/2015 FINDINGS: There is mild left basilar airspace disease on the lateral view which may reflect atelectasis versus pneumonia. There is no other focal parenchymal opacity. There is no pleural effusion or pneumothorax. The heart and mediastinal contours are unremarkable. The osseous structures are unremarkable. IMPRESSION: Mild left basilar airspace disease on the lateral view which may reflect atelectasis versus pneumonia. Electronically Signed   By: Elige Ko   On: 04/13/2017 10:02    EKG: Independently reviewed.  Assessment/Plan Active Problems:   GIB (gastrointestinal bleeding)   Hematuria   Right renal mass   CKD (chronic kidney disease), stage V (HCC)   CAP (community acquired pneumonia)     Symptomatic Anemia of chronic disease in the setting of GIB and hematuria likely due to malignancy. EDP Hcult positive  Hemoglobin on admission  . Baseline Hb  Hcult + Hb 7.2 Bl bet 8-9, BUN 46. Patient is on chronic oral Iron. Family does not wish to proceed with aggressive measures such as colonoscopy/ GI/ GU/ hematology/ oncology consult.  They wish to continue supportive measures, but do agree to transfusion.  Admit to tele obs  Transfuse 2 unit packed red blood cells if Hb less than 8 or acutely bleeding Repeat CBC in am Continue oral Iron supplement  Palliative care consult to help in the management of comfort issues   Chronic kidney disease stage 5  In the setting of malignancy.  baseline creatinine 6   Current Cr 7.7.   UA pending History of renal mass. Family not interested in aggressive measures such as hemodialysis. Likely, his renal functions will continue to worsened. After discussion with wife and daughter, will proceed with Palliativce care consult Continue Cardura  Lab Results  Component Value Date   CREATININE 7.70 (H) 04/13/2017   CREATININE 6.66 (H)  04/13/2017   CREATININE 5.99 (H) 11/23/2015      Incidental LLL pneumonia versus atelectasis in a patient with COPD without  exacerbation. CXR  with same intermittent cough Placed on Rocephin and Zitrhomax IV. Cultures pending. Afebrile Osats normal.  WBC 9.6   Pneumonia  Focused order set   Mucinex prn Nebs q 6 prn wheezing or shortness of breath  O2 prn  Blood and sputum cultures    Insomnia Continue Remeron     DVT prophylaxis:   SCD's    Code Status:   DNR Family Communication:  Discussed with family Disposition Plan: Expect patient to be discharged to home after condition improves Consults called:    Palliative care for goals of care and comfort issues  Admission status:Tele Obs     [facility] E, PA-C [facility]   04/13/2017, 3:38 PM

## 2017-04-14 ENCOUNTER — Observation Stay (HOSPITAL_COMMUNITY): Payer: Medicare HMO

## 2017-04-14 DIAGNOSIS — Z7189 Other specified counseling: Secondary | ICD-10-CM | POA: Diagnosis not present

## 2017-04-14 DIAGNOSIS — Z515 Encounter for palliative care: Secondary | ICD-10-CM

## 2017-04-14 DIAGNOSIS — K922 Gastrointestinal hemorrhage, unspecified: Secondary | ICD-10-CM | POA: Diagnosis not present

## 2017-04-14 DIAGNOSIS — J181 Lobar pneumonia, unspecified organism: Secondary | ICD-10-CM | POA: Diagnosis not present

## 2017-04-14 DIAGNOSIS — N185 Chronic kidney disease, stage 5: Secondary | ICD-10-CM | POA: Diagnosis not present

## 2017-04-14 LAB — COMPREHENSIVE METABOLIC PANEL
ALK PHOS: 38 U/L (ref 38–126)
ALT: 8 U/L — AB (ref 17–63)
AST: 12 U/L — ABNORMAL LOW (ref 15–41)
Albumin: 2.7 g/dL — ABNORMAL LOW (ref 3.5–5.0)
Anion gap: 8 (ref 5–15)
BILIRUBIN TOTAL: 0.8 mg/dL (ref 0.3–1.2)
BUN: 51 mg/dL — AB (ref 6–20)
CO2: 15 mmol/L — ABNORMAL LOW (ref 22–32)
CREATININE: 6.22 mg/dL — AB (ref 0.61–1.24)
Calcium: 9.4 mg/dL (ref 8.9–10.3)
Chloride: 117 mmol/L — ABNORMAL HIGH (ref 101–111)
GFR calc Af Amer: 8 mL/min — ABNORMAL LOW (ref >60)
GFR calc non Af Amer: 7 mL/min — ABNORMAL LOW (ref >60)
GLUCOSE: 116 mg/dL — AB (ref 65–99)
POTASSIUM: 4.3 mmol/L (ref 3.5–5.1)
SODIUM: 140 mmol/L (ref 135–145)
Total Protein: 5.7 g/dL — ABNORMAL LOW (ref 6.5–8.1)

## 2017-04-14 LAB — CBC
HEMATOCRIT: 26.1 % — AB (ref 39.0–52.0)
HEMOGLOBIN: 8.8 g/dL — AB (ref 13.0–17.0)
MCH: 29 pg (ref 26.0–34.0)
MCHC: 33.7 g/dL (ref 30.0–36.0)
MCV: 86.1 fL (ref 78.0–100.0)
Platelets: 135 10*3/uL — ABNORMAL LOW (ref 150–400)
RBC: 3.03 MIL/uL — ABNORMAL LOW (ref 4.22–5.81)
RDW: 15 % (ref 11.5–15.5)
WBC: 10 10*3/uL (ref 4.0–10.5)

## 2017-04-14 LAB — BPAM RBC
BLOOD PRODUCT EXPIRATION DATE: 201804262359
Blood Product Expiration Date: 201804262359
ISSUE DATE / TIME: 201804161347
ISSUE DATE / TIME: 201804162142
UNIT TYPE AND RH: 1700
Unit Type and Rh: 1700

## 2017-04-14 LAB — TYPE AND SCREEN
ABO/RH(D): B POS
ANTIBODY SCREEN: NEGATIVE
UNIT DIVISION: 0
UNIT DIVISION: 0

## 2017-04-14 NOTE — Consult Note (Signed)
Consultation Note Date: 04/14/2017   Patient Name: Brett Perry  DOB: 1920/04/11  MRN: [id number]  Age / Sex: 83 y.o., male  PCP: Christain Sacramento, MD Referring Physician: Reyne Dumas, MD  Reason for Consultation: Establishing goals of care  HPI/Patient Profile: 83 y.o. male  with past medical history of CKD (Stage 5) , renal mass concerning for carcinoma (no workup desired), COPD, HTN admitted on 04/13/2017 with progressive worsening GI bleeding, hematuria. Patient and family have voiced no aggressive measures desired- palliative medicine consulted for [place].    Clinical Assessment and Goals of Care: Met with patient and his spouse.   Introduced palliative medicine. Palliative medicine is specialized medical care for people living with serious illness. It focuses on providing relief from the symptoms and stress of a serious illness. The goal is to improve quality of life for both the patient and the family.  Answered spouse's questions re: difference between palliative medicine and hospice. Spouse tells me they absolutely do not want patient to "go to a hospice house or a nursing home".   Patient's spouse asks for any further discussion to be held until their daughter, Lattie Haw, could be present. Spoke with Lattie Haw via telephone and planned f/u meeting for tomorrow morning at 11am.     Primary Decision Maker NEXT OF KIN patient's spouse and daughter    SUMMARY OF RECOMMENDATIONS -F/u [place] meeting tomorrow morning at [facility]:  DNR  Prognosis:    Unable to determine  Discharge Planning: To Be Determined  Primary Diagnoses: Present on Admission: . Right renal mass . CKD (chronic kidney disease), stage V ([place]) . CAP (community acquired pneumonia) . GIB (gastrointestinal bleeding) . Hematuria   I have reviewed the medical record, interviewed the patient and family, and  examined the patient. The following aspects are pertinent.  Past Medical History:  Diagnosis Date  . Anosmia   . CKD (chronic kidney disease)   . COPD (chronic obstructive pulmonary disease) ([place])    pt and wife deny  . Dermatitis   . Hearing loss   . Hypertension    Benign Essential Hypertension  . Macular degeneration   . Proteinuria   . Right kidney mass    Social History   Social History  . Marital status: Married    Spouse name: N/A  . Number of children: N/A  . Years of education: N/A   Social History Main Topics  . Smoking status: Never Smoker  . Smokeless tobacco: Never Used  . Alcohol use No  . Drug use: No  . Sexual activity: Not Asked   Other Topics Concern  . None   Social History Narrative  . None   Family History  Problem Relation Age of Onset  . Aneurysm Brother    Scheduled Meds: . doxazosin  2 mg Oral Daily  . ferrous sulfate   Oral Daily  . finasteride  5 mg Oral Daily  . mirtazapine  15 mg Oral QHS  . sodium chloride  flush  3 mL Intravenous Q12H   Continuous Infusions: . sodium chloride    . azithromycin    . cefTRIAXone (ROCEPHIN)  IV 1 g (04/14/17 1439)   PRN Meds:.acetaminophen **OR** acetaminophen, albuterol, bisacodyl, guaiFENesin, HYDROcodone-acetaminophen, magnesium citrate, ondansetron **OR** ondansetron (ZOFRAN) IV, senna-docusate Medications Prior to Admission:  Prior to Admission medications   Medication Sig Start Date End Date Taking? Authorizing Provider  Besifloxacin HCl 0.6 % SUSP Apply 1 drop to eye 4 (four) times daily. After injection at eye doctor for macular degeneration.   Yes Historical Provider, MD  doxazosin (CARDURA) 2 MG tablet Take 2 mg by mouth daily.   Yes Historical Provider, MD  ergocalciferol (VITAMIN D2) 50000 UNITS capsule Take 50,000 Units by mouth once a week.   Yes Historical Provider, MD  Ferrous Sulfate (IRON) 325 (65 FE) MG TABS Take 65 capsules by mouth daily.   Yes Historical Provider, MD    finasteride (PROSCAR) 5 MG tablet Take 5 mg by mouth daily.   Yes Historical Provider, MD  mirtazapine (REMERON) 15 MG tablet Take 15 mg by mouth at bedtime.   Yes Historical Provider, MD  Multiple Vitamin (MULTIVITAMIN) tablet Take 1 tablet by mouth daily.   Yes Historical Provider, MD  Multiple Vitamins-Minerals (ICAPS AREDS 2 PO) Take 2 capsules by mouth daily.   Yes Historical Provider, MD  Omega-3 Fatty Acids (FISH OIL) 1000 MG CAPS Take by mouth.   Yes Historical Provider, MD  sodium bicarbonate 650 MG tablet Take 650 mg by mouth 3 (three) times daily.  06/08/15  Yes Historical Provider, MD  azithromycin (ZITHROMAX) 250 MG tablet Take 1 tablet (250 mg total) by mouth daily. Start 11/26 in AM Patient not taking: Reported on 04/13/2017 11/23/15   Samuella Cota, MD  cefUROXime (CEFTIN) 500 MG tablet Take 1 tablet (500 mg total) by mouth daily. Patient not taking: Reported on 04/13/2017 11/23/15   Samuella Cota, MD  docusate sodium (COLACE) 100 MG capsule Take 1 capsule (100 mg total) by mouth every 12 (twelve) hours. Patient not taking: Reported on 04/13/2017 08/17/15   Elnora Morrison, MD   No Known Allergies Review of Systems  Physical Exam  Vital Signs: BP (!) 134/54 (BP Location: Right Arm)   Pulse 61   Temp 98.1 F (36.7 C) (Oral)   Resp 16   Ht _0  (1.702 m)   Wt 61.6 kg (135 lb 11.2 oz)   SpO2 96%   BMI 21.25 kg/m  Pain Assessment: No/denies pain   Pain Score: 0-No pain   SpO2: SpO2: 96 % O2 Device:SpO2: 96 % O2 Flow Rate: .   IO: Intake/output summary:  Intake/Output Summary (Last 24 hours) at 04/14/17 1524 Last data filed at 04/14/17 0945  Gross per 24 hour  Intake              500 ml  Output              400 ml  Net              100 ml    LBM: Last BM Date: 04/14/17 Baseline Weight: Weight: 60.9 kg (134 lb 4.8 oz) Most recent weight: Weight: 61.6 kg (135 lb 11.2 oz)     Palliative Assessment/Data: PPS: 20%     Thank you for this consult.  Palliative medicine will continue to follow and assist as needed.   Time In: 1430 Time Out: 1530 Time Total: 60 minutes Greater than 50%  of this time  was spent counseling and coordinating care related to the above assessment and plan.  Signed by: Mariana Kaufman, AGNP-C Palliative Medicine    Please contact Palliative Medicine Team phone at [phone] for questions and concerns.  For individual provider: See Shea Evans

## 2017-04-14 NOTE — Progress Notes (Signed)
Patient received second unit of blood during the night with no signs or symptoms of reaction. Macarthur Critchley, RN

## 2017-04-14 NOTE — Progress Notes (Signed)
[facility] PROGRESS NOTE  Brett Perry FAO:[id number] DOB: [birth year] DOA: 04/13/2017   PCP: [name], MD     Assessment/Plan: Active Problems:   Right renal mass   CKD (chronic kidney disease), stage V (H[facility])   CAP (community acquired pneumonia)   GIB (gastrointestinal bleeding)   Hematuria   83 y.o. male with a history of CKD Stage 5, Known Renal mass suspicious for renal cel carcinoma  never on treatment, COPD, HTN, history of Internet hemorrhoids, presenting with progressive, worsening bright blood per rectum, worse over the last 2 to 3 days, as well as hematuria. . Initial hemoglobin 7.4. FOBT positive.  Assessment and plan Symptomatic Anemia of chronic disease in the setting of GIB and hematuria likely due to malignancy. FOBT positive  Hemoglobin on admission 7.4  . Baseline 8-9, Patient is on chronic oral Iron. Family does not wish to proceed with aggressive measures such as colonoscopy/ GI/ GU/ hematology/ oncology consult.  They wish to continue supportive measures,   agree to transfusion.  Continue telemetry Status post 2 unit packed red blood cells . Hemoglobin improved from 7.1>8.8 Most recent CT abdomen pelvis was 11/22/15-exophytic mass in the right kidney, highly suspicious for malignancy AAA measuring 3.5 cm. No repeat imaging since then, daughter and wife do not want any imaging studies done  Family would like to defer repeat imaging Palliative care consult to help in the management of comfort issues    Oliguric renal failure, Chronic kidney disease stage 5-urine output 400 mL overnight   In the setting of malignancy.  baseline creatinine 6   Current Cr 7.7.   UA pending  History of renal mass. Family not interested in aggressive measures such as hemodialysis. Likely, his renal functions will continue to worsened. After discussion with wife and daughter, will proceed with Palliativce care consult      Incidental LLL pneumonia versus  atelectasis in a patient with COPD without  exacerbation. CXR with same intermittent cough Continue Rocephin and Zitrhomax IV. Day #2 Cultures pending. Afebrile Osats normal.  WBC 9.6   Pneumonia  Focused order set  Mucinex prn Nebs q 6 prn wheezing or shortness of breath  O2 prn  Blood and sputum cultures    Insomnia Continue Remeron     DVT prophylaxsis SCDs  Code Status:  DO NOT RESUSCITATE     Family Communication: Discussed in detail with the patient's wife and daughter, all imaging results, lab results explained to the patient   Disposition Plan:  Palliative care consultation, disposition pending      Consultants:  Palliative care  Procedures:  None  Antibiotics: Anti-infectives    Start     Dose/Rate Route Frequency Ordered Stop   04/14/17 1400  azithromycin (ZITHROMAX) 500 mg in dextrose 5 % 250 mL IVPB     500 mg 250 mL/hr over 60 Minutes Intravenous Every 24 hours 04/13/17 1303 04/21/17 1359   04/14/17 1230  cefTRIAXone (ROCEPHIN) 1 g in dextrose 5 % 50 mL IVPB     1 g 100 mL/hr over 30 Minutes Intravenous Every 24 hours 04/13/17 1303 04/21/17 1229   04/13/17 1200  cefTRIAXone (ROCEPHIN) 1 g in dextrose 5 % 50 mL IVPB     1 g 100 mL/hr over 30 Minutes Intravenous  Once 04/13/17 1158 04/13/17 1259   04/13/17 1200  azithromycin (ZITHROMAX) 500 mg in dextrose 5 % 250 mL IVPB     500 mg 250 mL/hr over 60 Minutes  Intravenous  Once 04/13/17 1158 04/13/17 1416         HPI/Subjective: "he has been very sleepy and tired", denies any active bleeding   Objective: Vitals:   04/13/17 2141 04/13/17 2213 04/14/17 0015 04/14/17 0625  BP: 130/61 (!) 125/58 (!) 117/51 (!) 109/50  Pulse: 80 78 69 65  Resp: Temp: 99 F (37.2 C) 98.5 F (36.9 C) 98.7 F (37.1 C) 98.5 F (36.9 C)  TempSrc: Oral Oral Oral   SpO2: 98%   98%  Weight:    61.6 kg (135 lb 11.2 oz)  Height:        Intake/Output Summary (Last 24 hours) at 04/14/17 0849 Last  data filed at 04/14/17 0809  Gross per 24 hour  Intake              615 ml  Output              400 ml  Net              215 ml    Exam:  Examination:  General exam: frail,pale  Respiratory system: Clear to auscultation. Respiratory effort normal. Cardiovascular system: S1 & S2 heard, RRR. No JVD, murmurs, rubs, gallops or clicks. No pedal edema. Gastrointestinal system: Abdomen is nondistended, soft and nontender. No organomegaly or masses felt. Normal bowel sounds heard. Central nervous system: Alert and oriented. No focal neurological deficits. Extremities: Symmetric 5 x 5 power. Skin: No rashes, lesions or ulcers Psychiatry: Judgement and insight appear normal. Mood & affect appropriate.     Data Reviewed: I have personally reviewed following labs and imaging studies  Micro Results No results found for this or any previous visit (from the past 240 hour(s)).  Radiology Reports Dg Chest 2 View  Result Date: 04/14/2017 CLINICAL DATA:  Follow-up pneumonia EXAM: CHEST  2 VIEW COMPARISON:  04/13/2017 FINDINGS: Cardiac shadow is stable. Increasing density is noted in the lateral projection posteriorly consistent with progressive infiltrate. It is not as well appreciated on the frontal exam. No bony abnormality is noted. IMPRESSION: Increasing posterior infiltrate. Electronically Signed   By: Alcide Clever M.D.   On: 04/14/2017 07:44   Dg Chest 2 View  Result Date: 04/13/2017 CLINICAL DATA:  Pt arrives with wife from home with c/o of rectal bleeding with BM's for the past 2 weeks. Pt states he has been having about 3 episodes of bleeding each day for the last 3 days with each BM. EXAM: CHEST  2 VIEW COMPARISON:  11/30/2015 FINDINGS: There is mild left basilar airspace disease on the lateral view which may reflect atelectasis versus pneumonia. There is no other focal parenchymal opacity. There is no pleural effusion or pneumothorax. The heart and mediastinal contours are unremarkable. The  osseous structures are unremarkable. IMPRESSION: Mild left basilar airspace disease on the lateral view which may reflect atelectasis versus pneumonia. Electronically Signed   By: Elige Ko   On: 04/13/2017 10:02     CBC  Recent Labs Lab 04/13/17 0935 04/13/17 0947 04/14/17 0527  WBC 9.6  --  10.0  HGB 7.4* 7.1* 8.8*  HCT 23.1* 21.0* 26.1*  PLT 169  --  135*  MCV 87.2  --  86.1  MCH 27.9  --  29.0  MCHC 32.0  --  33.7  RDW 14.6  --  15.0    Chemistries   Recent Labs Lab 04/13/17 0935 04/13/17 0947 04/14/17 0527  NA 140 141 140  K 4.5 4.5  4.3  CL 117* 123* 117*  CO2 13*  --  15*  GLUCOSE 102* 108* 116*  BUN 50* 46* 51*  CREATININE 6.66* 7.70* 6.22*  CALCIUM 9.6  --  9.4  AST 17  --  12*  ALT 10*  --  8*  ALKPHOS 42  --  38  BILITOT 0.6  --  0.8   ------------------------------------------------------------------------------------------------------------------ estimated creatinine clearance is 6.3 mL/min (A) (by C-G formula based on SCr of 6.22 mg/dL (H)). ------------------------------------------------------------------------------------------------------------------ No results for input(s): HGBA1C in the last 72 hours. ------------------------------------------------------------------------------------------------------------------ No results for input(s): CHOL, HDL, LDLCALC, TRIG, CHOLHDL, LDLDIRECT in the last 72 hours. ------------------------------------------------------------------------------------------------------------------ No results for input(s): TSH, T4TOTAL, T3FREE, THYROIDAB in the last 72 hours.  Invalid input(s): FREET3 ------------------------------------------------------------------------------------------------------------------ No results for input(s): VITAMINB12, FOLATE, FERRITIN, TIBC, IRON, RETICCTPCT in the last 72 hours.  Coagulation profile No results for input(s): INR, PROTIME in the last 168 hours.  No results for input(s):  DDIMER in the last 72 hours.  Cardiac Enzymes No results for input(s): CKMB, TROPONINI, MYOGLOBIN in the last 168 hours.  Invalid input(s): CK ------------------------------------------------------------------------------------------------------------------ Invalid input(s): POCBNP   CBG: No results for input(s): GLUCAP in the last 168 hours.     Studies: Dg Chest 2 View  Result Date: 04/14/2017 CLINICAL DATA:  Follow-up pneumonia EXAM: CHEST  2 VIEW COMPARISON:  04/13/2017 FINDINGS: Cardiac shadow is stable. Increasing density is noted in the lateral projection posteriorly consistent with progressive infiltrate. It is not as well appreciated on the frontal exam. No bony abnormality is noted. IMPRESSION: Increasing posterior infiltrate. Electronically Signed   By: Alcide Clever M.D.   On: 04/14/2017 07:44   Dg Chest 2 View  Result Date: 04/13/2017 CLINICAL DATA:  Pt arrives with wife from home with c/o of rectal bleeding with BM's for the past 2 weeks. Pt states he has been having about 3 episodes of bleeding each day for the last 3 days with each BM. EXAM: CHEST  2 VIEW COMPARISON:  11/30/2015 FINDINGS: There is mild left basilar airspace disease on the lateral view which may reflect atelectasis versus pneumonia. There is no other focal parenchymal opacity. There is no pleural effusion or pneumothorax. The heart and mediastinal contours are unremarkable. The osseous structures are unremarkable. IMPRESSION: Mild left basilar airspace disease on the lateral view which may reflect atelectasis versus pneumonia. Electronically Signed   By: Elige Ko   On: 04/13/2017 10:02      No results found for: HGBA1C Lab Results  Component Value Date   CREATININE 6.22 (H) 04/14/2017       Scheduled Meds: . sodium chloride   Intravenous Once  . azithromycin  500 mg Intravenous Q24H  . cefTRIAXone (ROCEPHIN)  IV  1 g Intravenous Q24H  . doxazosin  2 mg Oral Daily  . ferrous sulfate   Oral  Daily  . finasteride  5 mg Oral Daily  . mirtazapine  15 mg Oral QHS  . sodium chloride flush  3 mL Intravenous Q12H   Continuous Infusions:   LOS: 0 days    Time spent: >30 MINS    Richarda Overlie  [facility] Pager [phone]. If 7PM-7AM, please contact night-coverage at [email or web address], password [facility] 04/14/2017, 8:49 AM  LOS: 0 days

## 2017-04-15 DIAGNOSIS — Z7189 Other specified counseling: Secondary | ICD-10-CM

## 2017-04-15 DIAGNOSIS — J181 Lobar pneumonia, unspecified organism: Secondary | ICD-10-CM | POA: Diagnosis not present

## 2017-04-15 DIAGNOSIS — K922 Gastrointestinal hemorrhage, unspecified: Secondary | ICD-10-CM | POA: Diagnosis not present

## 2017-04-15 DIAGNOSIS — N185 Chronic kidney disease, stage 5: Secondary | ICD-10-CM | POA: Diagnosis not present

## 2017-04-15 LAB — COMPREHENSIVE METABOLIC PANEL
ALBUMIN: 2.5 g/dL — AB (ref 3.5–5.0)
ALT: 9 U/L — AB (ref 17–63)
AST: 11 U/L — AB (ref 15–41)
Alkaline Phosphatase: 39 U/L (ref 38–126)
Anion gap: 9 (ref 5–15)
BUN: 50 mg/dL — AB (ref 6–20)
CHLORIDE: 116 mmol/L — AB (ref 101–111)
CO2: 14 mmol/L — AB (ref 22–32)
CREATININE: 6.24 mg/dL — AB (ref 0.61–1.24)
Calcium: 9.5 mg/dL (ref 8.9–10.3)
GFR calc Af Amer: 8 mL/min — ABNORMAL LOW (ref >60)
GFR calc non Af Amer: 7 mL/min — ABNORMAL LOW (ref >60)
GLUCOSE: 99 mg/dL (ref 65–99)
Potassium: 4 mmol/L (ref 3.5–5.1)
SODIUM: 139 mmol/L (ref 135–145)
Total Bilirubin: 0.6 mg/dL (ref 0.3–1.2)
Total Protein: 5.9 g/dL — ABNORMAL LOW (ref 6.5–8.1)

## 2017-04-15 LAB — CBC
HCT: 26.6 % — ABNORMAL LOW (ref 39.0–52.0)
Hemoglobin: 8.9 g/dL — ABNORMAL LOW (ref 13.0–17.0)
MCH: 28.9 pg (ref 26.0–34.0)
MCHC: 33.5 g/dL (ref 30.0–36.0)
MCV: 86.4 fL (ref 78.0–100.0)
PLATELETS: 150 10*3/uL (ref 150–400)
RBC: 3.08 MIL/uL — AB (ref 4.22–5.81)
RDW: 15.4 % (ref 11.5–15.5)
WBC: 7.9 10*3/uL (ref 4.0–10.5)

## 2017-04-15 MED ORDER — HYDROCODONE-ACETAMINOPHEN 5-325 MG PO TABS: 1.0000 | ORAL_TABLET | ORAL | 0 refills | Status: AC | PRN

## 2017-04-15 MED ORDER — SENNOSIDES-DOCUSATE SODIUM 8.6-50 MG PO TABS: 1.0000 | ORAL_TABLET | Freq: Every evening | ORAL | 1 refills | Status: AC | PRN

## 2017-04-15 MED ORDER — LEVOFLOXACIN 500 MG PO TABS: 500.0000 mg | ORAL_TABLET | Freq: Every day | ORAL | Status: DC

## 2017-04-15 NOTE — Care Management Obs Status (Signed)
MEDICARE OBSERVATION STATUS NOTIFICATION   Patient Details  Name: Brett Perry MRN: [id number] Date of Birth: 02/07/1920   Medicare Observation Status Notification Given:  Yes    Elliot Cousin, RN 04/15/2017, 9:19 AM

## 2017-04-15 NOTE — Progress Notes (Addendum)
Daily Progress Note   Patient Name: Brett Perry       Date: 04/15/2017 DOB: Jun 07, 1920  Age: 83 y.o. MRN#: [id number] Attending Physician: Domenic Polite, MD Primary Care Physician: Woody Seller, MD Admit Date: 04/13/2017  Reason for Consultation/Follow-up: Establishing goals of care  Subjective: Met with patient, patient's daughter and HCPOA- Lattie Haw and spouse at bedside. Patient is very [place] and deferred all decision making to family.  Prior to admission patient living at home with good quality of life. 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 He is independent with all ADL's and washes all the dishes at home. Family has noticed some decline over the last year. He has had some falls. Most recently he fell on Easter Sunday. He has been steadily losing weight. His wife notes that he is down to 129lbs now from 190lbs about 2 years ago. He doesn't have an appetite.   They have been told he likely has a renal mass, but they have declined further workup.  We discussed new findings of GI bleeding this admission. After further discussion they determined their [place] to be focusing on maintaining patient's independence and good quality of life at home for as long as possible. They do not choose further work up or testing.   We explored option of Hospice at home and how they may provide support. With continued GI bleeding that may be a result of progressing malignancy- patient may decline rather rapidly. I discussed this with family and supported them in accepting this new reality of patient's functional status.    Review of Systems  Unable to perform ROS: Other    Length of Stay: 0  Current Medications: Scheduled Meds:  . doxazosin  2 mg  Oral Daily  . ferrous sulfate   Oral Daily  . finasteride  5 mg Oral Daily  . mirtazapine  15 mg Oral QHS  . sodium chloride flush  3 mL Intravenous Q12H    Continuous Infusions: . sodium chloride    . azithromycin 500 mg (04/15/17 1344)  . cefTRIAXone (ROCEPHIN)  IV 1 g (04/15/17 1339)    PRN Meds: acetaminophen **OR** acetaminophen, albuterol, bisacodyl, guaiFENesin, HYDROcodone-acetaminophen, magnesium citrate, ondansetron **OR** ondansetron (ZOFRAN) IV, senna-docusate  Physical Exam  Constitutional:  Thin  frail  Cardiovascular: Normal rate and regular rhythm.   Pulmonary/Chest: Effort normal.  Neurological: He is alert.  Very HOH  Skin: Skin is warm and dry.  Scabbed peeling area on scalp  Nursing note and vitals reviewed.           Vital Signs: BP 123/63 (BP Location: Right Arm)   Pulse 65   Temp 97.4 F (36.3 C) (Oral)   Resp 18   Ht '5\' 7"'$  (1.702 m)   Wt 58.7 kg (129 lb 6.4 oz)   SpO2 97%   BMI 20.27 kg/m  SpO2: SpO2: 97 % O2 Device: O2 Device: Not Delivered O2 Flow Rate:    Intake/output summary:  Intake/Output Summary (Last 24 hours) at 04/15/17 1416 Last data filed at 04/15/17 1009  Gross per 24 hour  Intake              780 ml  Output              300 ml  Net              480 ml   LBM: Last BM Date: 04/14/17 Baseline Weight: Weight: 60.9 kg (134 lb 4.8 oz) Most recent weight: Weight: 58.7 kg (129 lb 6.4 oz)       Palliative Assessment/Data: PPS: 70%      Patient Active Problem List   Diagnosis Date Noted  . Goals of care, counseling/discussion   . Palliative care by specialist   . GIB (gastrointestinal bleeding) 04/13/2017  . Hematuria 04/13/2017  . Lobar pneumonia ([place])   . Benign hypertension with chronic kidney disease, stage V ([place]) 07/02/2016  . Anemia in stage 5 chronic kidney disease ([place]) 02/26/2016  . Hearing loss 02/26/2016  . Xerosis cutis 02/26/2016  . Pneumonia 11/22/2015  . CKD (chronic kidney disease), stage V ([place])  11/22/2015  . Bilateral thigh pain 11/22/2015  . CAP (community acquired pneumonia) 11/22/2015  . Right renal mass     Palliative Care Assessment & Plan   Patient Profile: 83 y.o. male  with past medical history of CKD (Stage 5) , renal mass concerning for carcinoma (no workup desired), COPD, HTN admitted on 04/13/2017 with progressive worsening GI bleeding over the last 3 weeks, and hematuria. Patient and family have voiced no aggressive measures desired- palliative medicine consulted for [place].    Assessment/Recommendations/Plan   Hospice consult for possible eligibility d/t presumed malignant mass with no further workup or treatment planned, stage 5 CKD with chronic anemia  Based on patient's spouse and daughter's presentation of his functional status at home I am unsure about his current functional status now- recommend PT consult for current full functional status eval  Goals of Care and Additional Recommendations:  Limitations on Scope of Treatment: No Surgical Procedures  Code Status:  DNR  Prognosis:   Unable to determine likely less than six months if new onset GI bleeding is result of malignancy which I suspect may be the case due to patient's reported weight loss  Discharge Planning:  Home with Hospice if patient is eligible for Hospice admission  Care plan was discussed with patient's spouse and daughter.  Thank you for allowing the Palliative Medicine Team to assist in the care of this patient.   Time In: 1100 Time Out: 1200 Total Time 60 minutes Prolonged Time Billed No      Greater than 50%  of this time was spent counseling and coordinating care related to the above assessment and plan.  Mariana Kaufman,  AGNP-C Palliative Medicine   Please contact Palliative Medicine Team phone at [phone] for questions and concerns.

## 2017-04-15 NOTE — Care Management Note (Addendum)
Case Management Note  Patient Details  Name: Brett Perry MRN: [id number] Date of Birth: [birth year]  Subjective/Objective:  Right Renal Mass, CKD, CAP, GIB, Hematuria                  Action/Plan: Discharge Planning: NCM spoke to pt and wife, Laverne at bedside. Offered choice for Home Hospice. Wife requested [facility]PC[facility]. [facility]ed [facility] with [facility] referral. Wife requesting shower stool and Rolling Walker with seat.   4/19 1249 Letha Cape RN, BSN - Patient will need regular rolling walker to go home with, NCM called Lupita Leash with [facility] to bring it up to room.  Also paged MD to sign DNR form.    PCP Benedetto Goad H MD   Expected Discharge Date:  04/16/2017               Expected Discharge Plan:  Home w Hospice Care  In-House Referral:  NA  Discharge planning Services  CM Consult  Post Acute Care Choice:  Hospice Choice offered to:  Spouse  DME Arranged:  Walker rolling with seat, Shower stool DME Agency:  [facility]  HH Arranged:  RN [facility] Agency:  [facility]  Status of Service:  Completed, signed off  If discussed at [employer], dates discussed:    Additional Comments:  Elliot Cousin, RN 04/15/2017, 5:45 PM

## 2017-04-15 NOTE — Care Management Note (Signed)
Case Management Note  Patient Details  Name: OLSEN MCCUTCHAN MRN: [id number] Date of Birth: May 13, 1920  Subjective/Objective:  CKD, CAP, GIB                  Action/Plan:  Discharge Planning: NCM spoke to pt, wife and son. Pt was independent at home prior to hospital stay. Waiting final recommendations at home.   PCP Benedetto Goad H  Expected Discharge Date:  04/15/2017             Expected Discharge Plan:  Home/Self Care  In-House Referral:  NA  Discharge planning Services  CM Consult  Post Acute Care Choice:  NA Choice offered to:  NA  DME Arranged:  N/A DME Agency:  NA  HH Arranged:  NA HH Agency:  NA  Status of Service:  Completed, signed off  If discussed at Long Length of Stay Meetings, dates discussed:    Additional Comments:  Elliot Cousin, RN 04/15/2017, 9:37 AM

## 2017-04-15 NOTE — Discharge Summary (Signed)
Physician Discharge Summary  Brett Perry ZOX:[id number] DOB: 05-Jun-1920 DOA: 04/13/2017  PCP: [name], MD  Admit date: 04/13/2017 Discharge date: 04/15/2017  Time spent: 35 minutes  Recommendations for Outpatient Follow-up:  1. Discharge Home with Hospice Services   Discharge Diagnoses:  Active Problems:   Right renal mass   CKD (chronic kidney disease), stage V (HCC)   CAP (community acquired pneumonia)   GIB (gastrointestinal bleeding)   Hematuria   Goals of care, counseling/discussion   Palliative care by specialist   Discharge Condition: poor  Diet recommendation: comfort feeds  Filed Weights   04/13/17 1748 04/14/17 0625 04/15/17 0548  Weight: 60.9 kg (134 lb 4.8 oz) 61.6 kg (135 lb 11.2 oz) 58.7 kg (129 lb 6.4 oz)    History of present illness:  83 y.o.malewith a history of CKD Stage 5, Known Renal mass suspicious for renal cel carcinoma never worked up further, COPD, HTN, history of Internet hemorrhoids, presenting with progressive, worsening bright blood per rectum, worse over the last 2 to 3 days, as well as hematuria. . Initial hemoglobin 7.4. FOBT positive  Hospital Course:   Symptomatic Anemia of chronic disease in the setting of GIB and hematuria likely due to malignancy. -FOBT positive Hemoglobin on admission 7.4 . Baseline 8-9, -Family does not wish to proceed with aggressive measures such as colonoscopy/ GI/ GU/ hematology/ oncology consult. ,they wished to treat with supportive measures/blood transfusion.  -Status post 2unit packed red blood cells . Hemoglobin improved from 7.1>8.8 Most recent CT abdomen pelvis was 11/22/15-exophytic mass in the right kidney, highly suspicious for malignancy, this was not worked up, No repeat imaging since then, daughter and wife do not want any imaging studies done  -Palliative care consulted for goals of care, s/p family meeting, decision made for DC home with Hospice services  Oliguric renal failure,  Chronic kidney disease stage 5-  In the setting of presumed malignancy/renal mass highly suspicious for Renal cell carcinoma -baseline creatinine 6 Current Cr 7.7. -but remarkably stable, not HD candidate, no uremia or overload symptoms at this time -Home with Hospice  Known Renal mass/suspected RCC -see above    Incidental LLL pneumonia versus atelectasis in a patient with COPD without exacerbation.  -cannot rule out mets -started on Ceftriaxone/zithromax, changed to levaquin at discharge  Insomnia Continue Remeron       Consultations:  Palliative medicine  Discharge Exam: Vitals:   04/15/17 0548 04/15/17 1352  BP: 119/60 123/63  Pulse: 74 65  Resp: 18 18  Temp: 97.4 F (36.3 C) 97.4 F (36.3 C)    General: AAox2 Cardiovascular: S1S2/RRR Respiratory: fine basilar crackles  Discharge Instructions    Current Discharge Medication List    START taking these medications   Details  HYDROcodone-acetaminophen (NORCO/VICODIN) 5-325 MG tablet Take 1-2 tablets by mouth every 4 (four) hours as needed for moderate pain. Qty: 30 tablet, Refills: 0    senna-docusate (SENOKOT-S) 8.6-50 MG tablet Take 1 tablet by mouth at bedtime as needed for mild constipation. Qty: 10 tablet, Refills: 1      CONTINUE these medications which have NOT CHANGED   Details  Besifloxacin HCl 0.6 % SUSP Apply 1 drop to eye 4 (four) times daily. After injection at eye doctor for macular degeneration.    doxazosin (CARDURA) 2 MG tablet Take 2 mg by mouth daily.    Ferrous Sulfate (IRON) 325 (65 FE) MG TABS Take 65 capsules by mouth daily.    finasteride (PROSCAR) 5 MG tablet Take 5  mg by mouth daily.    mirtazapine (REMERON) 15 MG tablet Take 15 mg by mouth at bedtime.    Multiple Vitamin (MULTIVITAMIN) tablet Take 1 tablet by mouth daily.    Omega-3 Fatty Acids (FISH OIL) 1000 MG CAPS Take by mouth.    sodium bicarbonate 650 MG tablet Take 650 mg by mouth 3 (three) times daily.        STOP taking these medications     ergocalciferol (VITAMIN D2) 50000 UNITS capsule      Multiple Vitamins-Minerals (ICAPS AREDS 2 PO)      azithromycin (ZITHROMAX) 250 MG tablet      cefUROXime (CEFTIN) 500 MG tablet      docusate sodium (COLACE) 100 MG capsule        No Known Allergies Follow-up Information    [name], MD. Schedule an appointment as soon as possible for a visit in 1 week(s).   Specialty:  Family Medicine Contact information: [address] [place] [address] [phone]            The results of significant diagnostics from this hospitalization (including imaging, microbiology, ancillary and laboratory) are listed below for reference.    Significant Diagnostic Studies: Dg Chest 2 View  Result Date: 04/14/2017 CLINICAL DATA:  Follow-up pneumonia EXAM: CHEST  2 VIEW COMPARISON:  04/13/2017 FINDINGS: Cardiac shadow is stable. Increasing density is noted in the lateral projection posteriorly consistent with progressive infiltrate. It is not as well appreciated on the frontal exam. No bony abnormality is noted. IMPRESSION: Increasing posterior infiltrate. Electronically Signed   By: Alcide Clever M.D.   On: 04/14/2017 07:44   Dg Chest 2 View  Result Date: 04/13/2017 CLINICAL DATA:  Pt arrives with wife from home with c/o of rectal bleeding with BM's for the past 2 weeks. Pt states he has been having about 3 episodes of bleeding each day for the last 3 days with each BM. EXAM: CHEST  2 VIEW COMPARISON:  11/30/2015 FINDINGS: There is mild left basilar airspace disease on the lateral view which may reflect atelectasis versus pneumonia. There is no other focal parenchymal opacity. There is no pleural effusion or pneumothorax. The heart and mediastinal contours are unremarkable. The osseous structures are unremarkable. IMPRESSION: Mild left basilar airspace disease on the lateral view which may reflect atelectasis versus pneumonia. Electronically  Signed   By: Elige Ko   On: 04/13/2017 10:02    Microbiology: Recent Results (from the past 240 hour(s))  Blood culture (routine x 2)     Status: None (Preliminary result)   Collection Time: 04/13/17 12:25 PM  Result Value Ref Range Status   Specimen Description BLOOD RIGHT FOREARM  Final   Special Requests IN PEDIATRIC BOTTLE Blood Culture adequate volume  Final   Culture NO GROWTH 2 DAYS  Final   Report Status PENDING  Incomplete  Blood culture (routine x 2)     Status: None (Preliminary result)   Collection Time: 04/13/17 12:27 PM  Result Value Ref Range Status   Specimen Description BLOOD LEFT ANTECUBITAL  Final   Special Requests   Final    BOTTLES DRAWN AEROBIC AND ANAEROBIC Blood Culture adequate volume   Culture NO GROWTH 2 DAYS  Final   Report Status PENDING  Incomplete     Labs: Basic Metabolic Panel:  Recent Labs Lab 04/13/17 0935 04/13/17 0947 04/14/17 0527 04/15/17 0604  NA 140 141 140 139  K 4.5 4.5 4.3 4.0  CL 117* 123* 117*  116*  CO2 13*  --  15* 14*  GLUCOSE 102* 108* 116* 99  BUN 50* 46* 51* 50*  CREATININE 6.66* 7.70* 6.22* 6.24*  CALCIUM 9.6  --  9.4 9.5   Liver Function Tests:  Recent Labs Lab 04/13/17 0935 04/14/17 0527 04/15/17 0604  AST 17 12* 11*  ALT 10* 8* 9*  ALKPHOS 42 38 39  BILITOT 0.6 0.8 0.6  PROT 6.4* 5.7* 5.9*  ALBUMIN 3.2* 2.7* 2.5*   No results for input(s): LIPASE, AMYLASE in the last 168 hours. No results for input(s): AMMONIA in the last 168 hours. CBC:  Recent Labs Lab 04/13/17 0935 04/13/17 0947 04/14/17 0527 04/15/17 0604  WBC 9.6  --  10.0 7.9  HGB 7.4* 7.1* 8.8* 8.9*  HCT 23.1* 21.0* 26.1* 26.6*  MCV 87.2  --  86.1 86.4  PLT 169  --  135* 150   Cardiac Enzymes: No results for input(s): CKTOTAL, CKMB, CKMBINDEX, TROPONINI in the last 168 hours. BNP: BNP (last 3 results) No results for input(s): BNP in the last 8760 hours.  ProBNP (last 3 results) No results for input(s): PROBNP in the last  8760 hours.  CBG: No results for input(s): GLUCAP in the last 168 hours.     SignedZannie Cove MD.  [facility] 04/15/2017, 3:15 PM

## 2017-04-15 NOTE — Progress Notes (Signed)
[facility] Hospital Liaison:  RN visit [facility]  Received request from [facility] for family interest in [facility] services at home after discharge. Chart and patient information under review to confirm eligibility.  Spoke with patient and spouse at bedside to initiate education related to hospice philosophy, services and team approach to care. Spouse verbalized good understanding of information provided. Per discussion plan is for patient to discharge home via private vehicle tomorrow after DME has been delivered.   Rolator and shower chair have been ordered with Research scientist (physical sciences), Loistine Simas. Wife will be contact person for delivery.  Address has been confirmed.  [facility] referral center is aware of above.   Please send signed and completed out of facility DNR home with patient. Please send prescriptions for any medications patient does not already have including comfort medications.  Please fax final discharge summary to [facility] at [phone].  [facility] contact information given to spouse.  RNCM is aware of above.  Please do not hesitate to call with questions.   Thank you for the referral,  Adele Barthel, RN, BSN [facility] Liaison [phone]  All hospital liaison's are now on AMION.

## 2017-04-15 NOTE — Evaluation (Signed)
Physical Therapy Evaluation Patient Details Name: Brett Perry MRN: [id number] DOB: 1920-06-08 Today's Date: 04/15/2017   History of Present Illness  Pt is a 83 y.o. male admitted to ED on 04/13/2017 with symptomatic anemia with complaints of gross hematuria and bright red blood per rectum. Per chart, most recent abd-pelvis CT was 11/22/15 which showed exophytic mass in R kidney, highly suspicious for malignancy AAA; no repeat imaging since then, daughter and wife do not want any imaging studies done. CXR shows mild L basilar airspace which may reflect atelectasis vs pneumonia. Pertinent PMH includes CKD stage 5, known renal mass (suspicious for renal cell carcinoma, never on treatment), COPD, HTN.    Clinical Impression   Pt presents to PT with generalized weakness and impaired balance secondary to above. PTA, pt mod indep with SPC and lives at home with wife and son. Today, pt amb in hallway with SPC and min guard for balance; due to increased instability, trailed amb with RW which improved stability; pt and wife agreed on using a rollator upon d/c. Educ on fall risk prevention, DME use, and importance of mobility. Would benefit from continued acute PT to maximize functional mobility and independence prior to d/c home. Per Case Manager, pt going home with hospice care.    Follow Up Recommendations No PT follow up;Supervision for mobility/OOB    Equipment Recommendations  Other (comment) (Rollator)    Recommendations for Other Services OT consult     Precautions / Restrictions Precautions Precautions: Fall Restrictions Weight Bearing Restrictions: No      Mobility  Bed Mobility Overal bed mobility: Needs Assistance Bed Mobility: Supine to Sit     Supine to sit: Supervision        Transfers Overall transfer level: Needs assistance Equipment used: Straight cane Transfers: Sit to/from Stand Sit to Stand: Min guard         General transfer comment: Min guard for  balance  Ambulation/Gait Ambulation/Gait assistance: Min guard Ambulation Distance (Feet): 100 Feet (+10) Assistive device: Straight cane;Rolling walker (2 wheeled)   Gait velocity: Decreased Gait velocity interpretation: Below normal speed for age/gender General Gait Details: Amb in hallway with SPC and min guard for balance; increased time and lateral sway towards L-side, especially with turns. Amb in room with RW, stability much improved; wife requests pt take RW home to use.   Stairs            Wheelchair Mobility    Modified Rankin (Stroke Patients Only)       Balance Overall balance assessment: Needs assistance Sitting-balance support: No upper extremity supported;Feet supported Sitting balance-Leahy Scale: Good Sitting balance - Comments: Able to don shoes and socks at EOB indep     Standing balance-Leahy Scale: Poor Standing balance comment: Relies on UE support for standing balance                             Pertinent Vitals/Pain Pain Assessment: No/denies pain    Home Living Family/patient expects to be discharged to:: Private residence Living Arrangements: Spouse/significant other Available Help at Discharge: Available 24 hours/day Type of Home: House Home Access: Stairs to enter Entrance Stairs-Rails: Can reach both Entrance Stairs-Number of Steps: 4 Home Layout: One level Home Equipment: Cane - single point      Prior Function Level of Independence: Independent with assistive device(s)         Comments: Mod indep with SPC; enjoys yardwork and mowing lawns. Wife  reports pt indep with all ADLs.      Hand Dominance        Extremity/Trunk Assessment   Upper Extremity Assessment Upper Extremity Assessment: Generalized weakness    Lower Extremity Assessment Lower Extremity Assessment: Generalized weakness    Cervical / Trunk Assessment Cervical / Trunk Assessment: Kyphotic  Communication   Communication: HOH  Cognition  Arousal/Alertness: Awake/alert Behavior During Therapy: WFL for tasks assessed/performed Overall Cognitive Status: Impaired/Different from baseline Area of Impairment: Memory                     Memory: Decreased short-term memory         General Comments: Some inconsistencies with pt reporting PLOF (i.e. states he did not need his cane for all amb), able to be clarified by wife. Wife reports this is normal for him.      General Comments      Exercises     Assessment/Plan    PT Assessment Patient needs continued PT services  PT Problem List Decreased strength;Decreased balance;Decreased mobility;Decreased knowledge of use of DME       PT Treatment Interventions DME instruction;Gait training;Therapeutic exercise;Balance training;Stair training;Functional mobility training;Therapeutic activities;Patient/family education    PT Goals (Current goals can be found in the Care Plan section)  Acute Rehab PT Goals Patient Stated Goal: Return home PT Goal Formulation: With patient/family Time For Goal Achievement: 04/29/17 Potential to Achieve Goals: Good    Frequency Min 3X/week   Barriers to discharge        Co-evaluation               End of Session Equipment Utilized During Treatment: Gait belt Activity Tolerance: Patient tolerated treatment well Patient left: in chair;with call bell/phone within reach;with chair alarm set;with family/visitor present Nurse Communication: Mobility status PT Visit Diagnosis: Unsteadiness on feet (R26.81)    Time: 1610-9604 PT Time Calculation (min) (ACUTE ONLY): 27 min   Charges:   PT Evaluation $PT Eval Moderate Complexity: 1 Procedure PT Treatments $Gait Training: 8-22 mins   PT G Codes:   PT G-Codes **NOT FOR INPATIENT CLASS** Functional Assessment Tool Used: Clinical judgement Functional Limitation: Mobility: Walking and moving around Mobility: Walking and Moving Around Current Status (V4098): At least 1  percent but less than 20 percent impaired, limited or restricted Mobility: Walking and Moving Around Goal Status [phone]): At least 1 percent but less than 20 percent impaired, limited or restricted   Brett Perry, SPT [phone]  Ina Homes 04/15/2017, 4:11 PM

## 2017-04-16 DIAGNOSIS — Z515 Encounter for palliative care: Secondary | ICD-10-CM

## 2017-04-16 MED ORDER — LEVOFLOXACIN 500 MG PO TABS: 500.0000 mg | ORAL_TABLET | Freq: Every day | ORAL | 0 refills | Status: AC

## 2017-04-16 NOTE — Progress Notes (Addendum)
[facility] Hospital Liaison:  RN   Spoke with wife about rolator.  They are not covered by [facility].  She verbalized understanding.    I called and left message for Eunice Blase [facility], to advise on our end, patient ready to go home.    Spoke with Tresa Endo, RN and advised the same.    If you have any questions or concerns regarding hospice services, please feel free to contact me at the number below.   Thank you,  Adele Barthel, RN, BSN [facility] Liaison [phone]  All hospital liaison's are now on AMION.  **UPDATE :   Spoke with Gavin Pound, CMRN, she is getting patient a rolling walker here in the hospital before discharge.**   (I had already ordered with [facility], called Loistine Simas, Research scientist (physical sciences) [facility], and had her cancel it, per [facility] request.)

## 2017-04-16 NOTE — Progress Notes (Signed)
Physical Therapy Treatment Patient Details Name: Brett Perry MRN: [id number] DOB: 11/06/1920 Today's Date: 04/16/2017    History of Present Illness Pt is a 83 y.o. male admitted to ED on 04/13/2017 with symptomatic anemia with complaints of gross hematuria and bright red blood per rectum. Per chart, most recent abd-pelvis CT was 11/22/15 which showed exophytic mass in R kidney, highly suspicious for malignancy AAA; no repeat imaging since then, daughter and wife do not want any imaging studies done. CXR shows mild L basilar airspace which may reflect atelectasis vs pneumonia. Pertinent PMH includes CKD stage 5, known renal mass (suspicious for renal cell carcinoma, never on treatment), COPD, HTN.     PT Comments    Pt performed increased mobility during session and climbed stairs for safe entry home.  Pt will required RW at d/c as insurance will not cover a rollator and he will need RW for safety ambulating in home.     Follow Up Recommendations  No PT follow up;Supervision for mobility/OOB     Equipment Recommendations  Rolling walker with 5" wheels    Recommendations for Other Services OT consult     Precautions / Restrictions Precautions Precautions: Fall Restrictions Weight Bearing Restrictions: No    Mobility  Bed Mobility Overal bed mobility: Needs Assistance Bed Mobility: Supine to Sit     Supine to sit: Supervision     General bed mobility comments: Pt with posterior lean but able to correct without assistance.    Transfers Overall transfer level: Needs assistance Equipment used: Rolling walker (2 wheeled) Transfers: Sit to/from Stand Sit to Stand: Min guard         General transfer comment: Cues for hand placement to push from seated surface vs. pulling on RW.    Ambulation/Gait Ambulation/Gait assistance: Min guard Ambulation Distance (Feet): 150 Feet Assistive device: Rolling walker (2 wheeled) Gait Pattern/deviations: Step-through pattern;Trunk  flexed;Decreased stride length Gait velocity: Decreased   General Gait Details: Flexed shuffling gait with mild unsteadiness.  Pt will require min guard to stand by assistance at home.  Cues for positon in RW to improve safety.     Stairs Stairs: Yes          Wheelchair Mobility    Modified Rankin (Stroke Patients Only)       Balance Overall balance assessment: Needs assistance   Sitting balance-Leahy Scale: Good       Standing balance-Leahy Scale: Poor                              Cognition Arousal/Alertness: Awake/alert Behavior During Therapy: WFL for tasks assessed/performed Overall Cognitive Status: Within Functional Limits for tasks assessed                                 General Comments: Appears appropriate and follows commands well.  Pt HOH which impair communication.        Exercises      General Comments        Pertinent Vitals/Pain Pain Assessment: No/denies pain    Home Living                      Prior Function            PT Goals (current goals can now be found in the care plan section) Acute Rehab PT Goals Patient Stated Goal: Return home  Potential to Achieve Goals: Good Progress towards PT goals: Progressing toward goals    Frequency    Min 3X/week      PT Plan Current plan remains appropriate    Co-evaluation             End of Session Equipment Utilized During Treatment: Gait belt Activity Tolerance: Patient tolerated treatment well Patient left: in chair;with call bell/phone within reach;with chair alarm set;with family/visitor present Nurse Communication: Mobility status PT Visit Diagnosis: Unsteadiness on feet (R26.81)     Time: 6962-9528 PT Time Calculation (min) (ACUTE ONLY): 24 min  Charges:  $Gait Training: 8-22 mins $Therapeutic Activity: 8-22 mins                    G CodesJoycelyn Rua, PTA pager [phone]    Florestine Avers 04/16/2017, 12:34  PM

## 2017-04-18 LAB — CULTURE, BLOOD (ROUTINE X 2)
Culture: NO GROWTH
Culture: NO GROWTH
SPECIAL REQUESTS: ADEQUATE
Special Requests: ADEQUATE

## 2017-05-18 DIAGNOSIS — Z7189 Other specified counseling: Secondary | ICD-10-CM

## 2017-05-18 DIAGNOSIS — K625 Hemorrhage of anus and rectum: Secondary | ICD-10-CM

## 2017-07-10 ENCOUNTER — Encounter (INDEPENDENT_AMBULATORY_CARE_PROVIDER_SITE_OTHER): Payer: Medicare HMO | Admitting: Ophthalmology

## 2017-07-30 IMAGING — CT CT ABD-PELV W/O CM
2 of 4 series · 15 of 46 positions shown, 17 images · non-contrast
Comparison: CT of the abdomen and pelvis from 08/29/2015

CLINICAL DATA: Assess known abdominal aortic aneurysm. Subsequent
encounter.

EXAM:
CT ABDOMEN AND PELVIS WITHOUT CONTRAST
TECHNIQUE: Multidetector CT imaging of the abdomen and pelvis was performed
following the standard protocol without IV contrast.

[Series 2: abdomen/pelvis w/o contrast · axial · non-contrast · 0.81mm/px · z∈[-592,-76]mm · 12 of 123 slices shown, 14 images]
[im 10/123  soft-tissue]
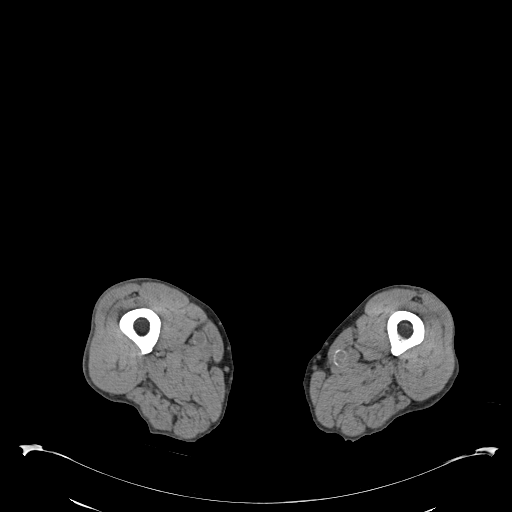
[im 10/123  bone]
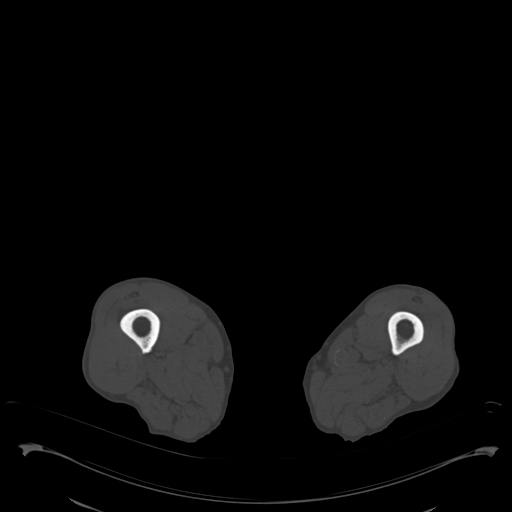
[im 19/123  soft-tissue]
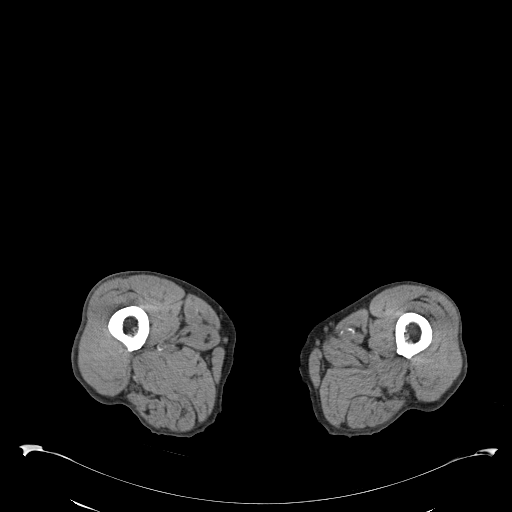
[im 29/123  soft-tissue]
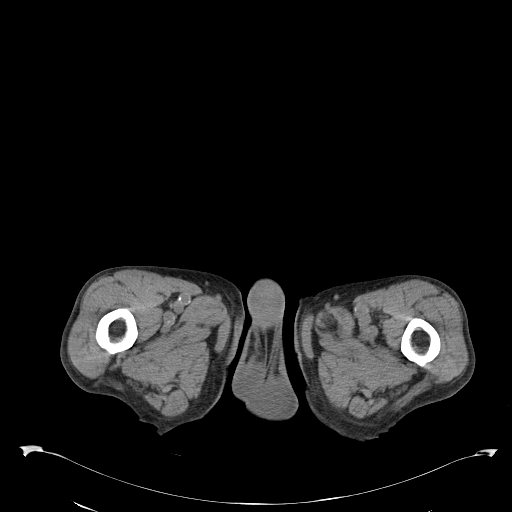
[im 38/123  soft-tissue]
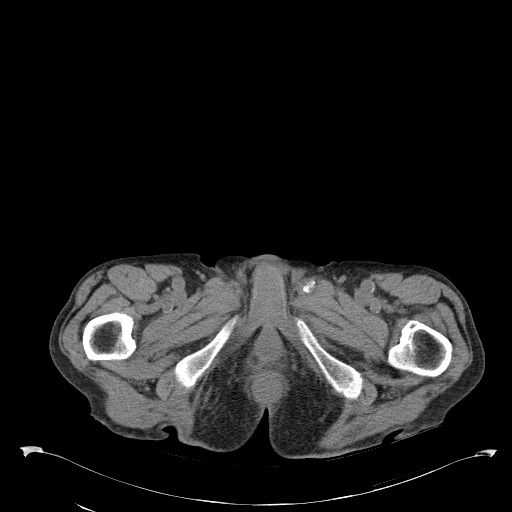
[im 47/123  soft-tissue]
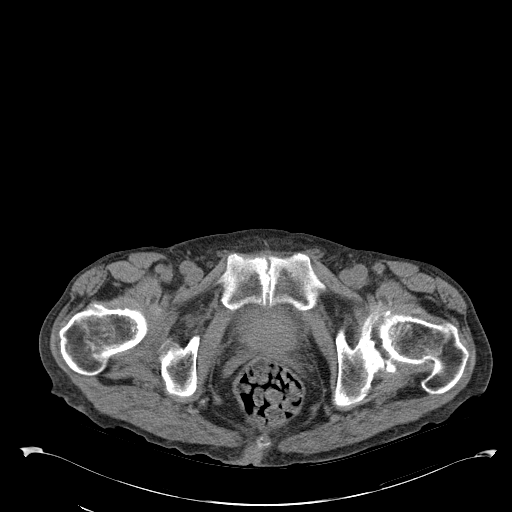
[im 57/123  soft-tissue]
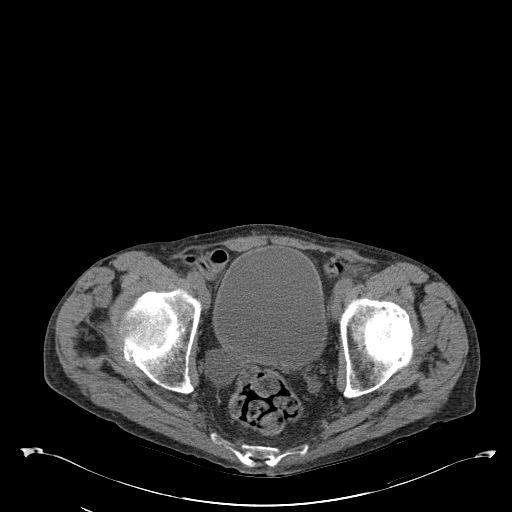
[im 66/123  soft-tissue]
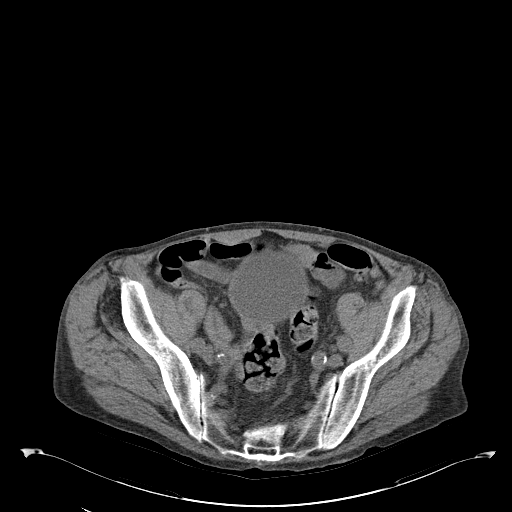
[im 76/123  soft-tissue]
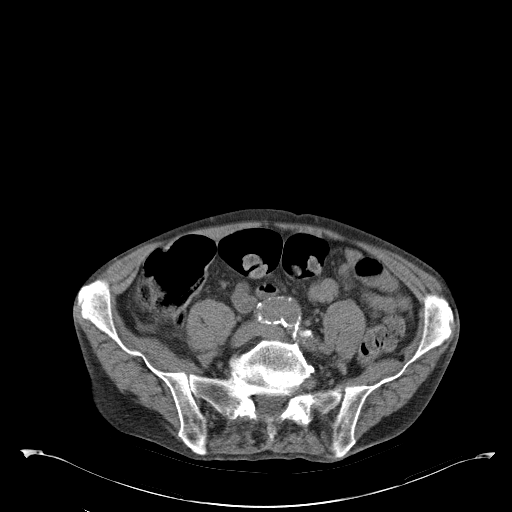
[im 85/123  soft-tissue]
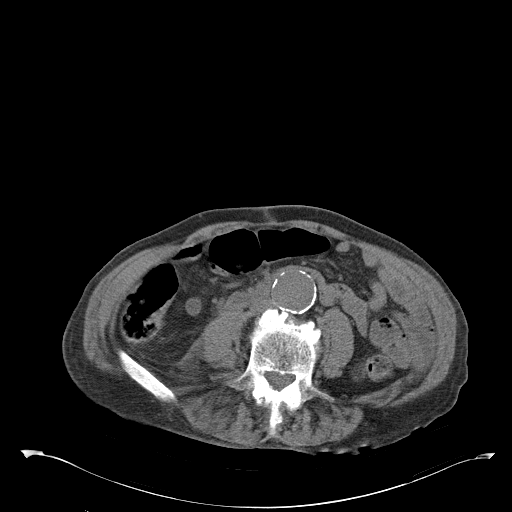
[im 85/123  bone]
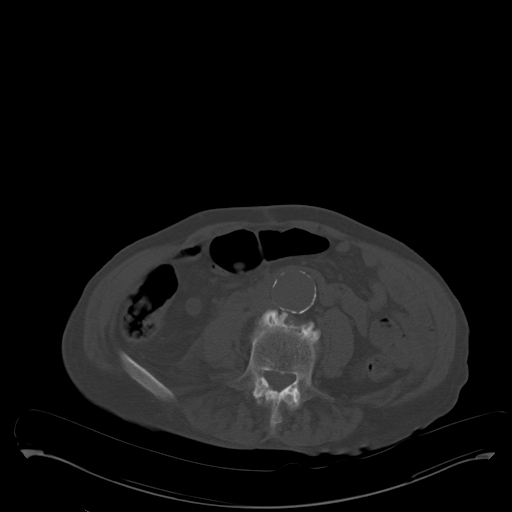
[im 94/123  soft-tissue]
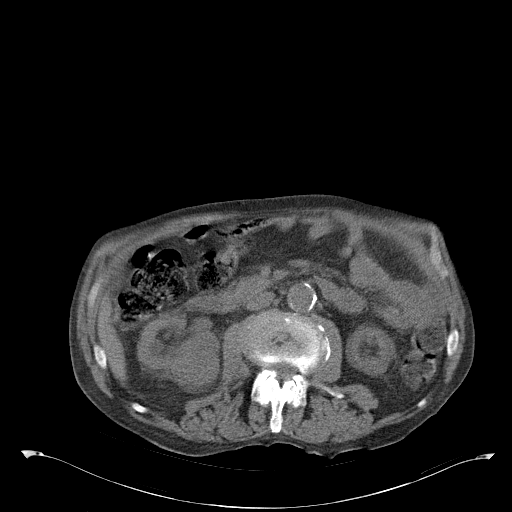
[im 104/123  soft-tissue]
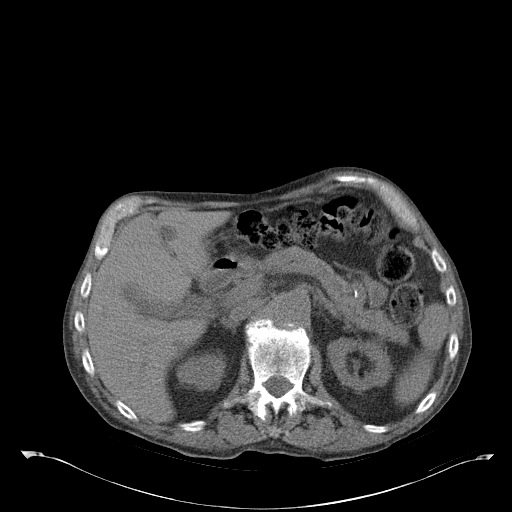
[im 113/123  soft-tissue]
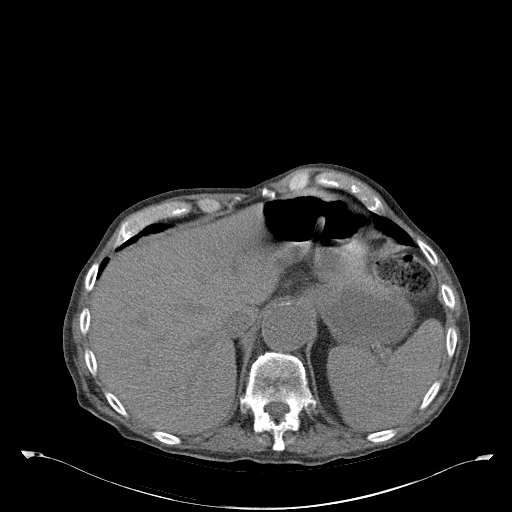

[Series 3: mpr cor 3.0mm · coronal · 0.90mm/px · 3 of 80 slices shown]
[im 27/80  soft-tissue]
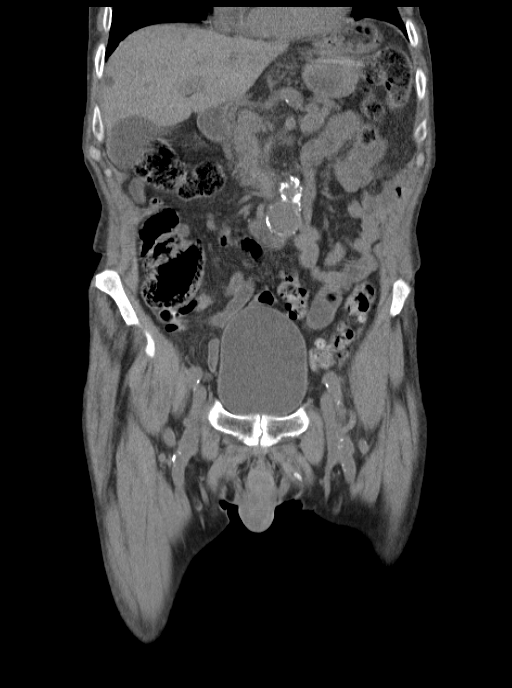
[im 36/80  soft-tissue]
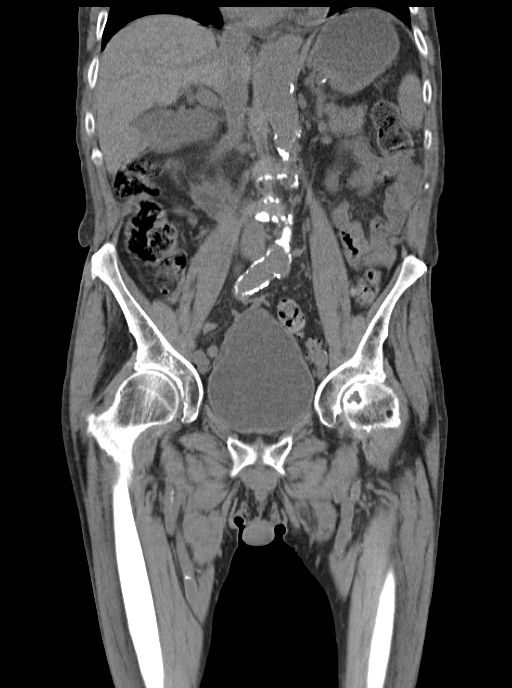
[im 44/80  soft-tissue]
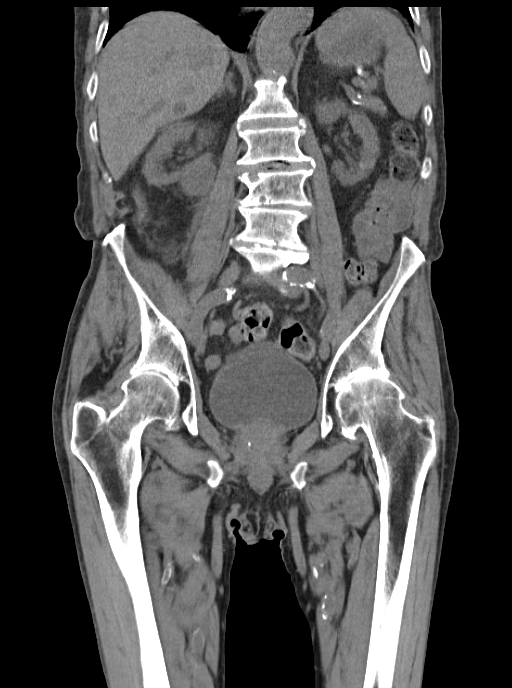

[15 of 46 positions shown; findings below may reference images not displayed]

FINDINGS: Dense right basilar airspace opacification is compatible with
pneumonia.

Scattered vague hypodensities are seen in the liver, measuring up to
1.5 cm in size. These are nonspecific but may reflect cysts. The
spleen is unremarkable in appearance. The gallbladder is grossly
unremarkable, though difficult to fully assess due to motion
artifact. The pancreas and adrenal glands are unremarkable.

The exophytic mass arising at the lower pole of the right kidney
appears to have increased mildly in size, measuring 5.1 x 4.1 cm. As
before, this is concerning for malignancy.

Nonspecific perinephric stranding is noted bilaterally. Mild
bilateral renal atrophy is seen. There is no evidence of
hydronephrosis. No renal or ureteral stones are identified.

No free fluid is identified. The small bowel is unremarkable in
appearance. The stomach is within normal limits. No acute vascular
abnormalities are seen.

Relatively diffuse calcification is seen along the abdominal aorta
and its branches. There is relatively stable aneurysmal dilatation
of the infrarenal abdominal aorta to 3.5 cm in maximal AP dimension.
Ectasia is noted along the common iliac arteries bilaterally.

The appendix is normal in caliber, without evidence for
appendicitis. The colon is partially filled with stool. Scattered
diverticulosis is noted along the proximal sigmoid colon, without
evidence of diverticulitis.

The bladder is moderately distended; there is suggestion of a
right-sided bladder diverticulum. The prostate is grossly
unremarkable in appearance. No inguinal lymphadenopathy is seen.

No acute osseous abnormalities are identified. Multilevel disc space
narrowing is noted along the lumbar spine, with vacuum phenomenon at
L2-L3, and mild grade 1 anterolisthesis of L3 on L4, reflecting
underlying facet disease.
IMPRESSION: 1. Right basilar pneumonia noted.
2. Exophytic mass at the lower pole the right kidney has increased
mildly in size, measuring 5.1 x 4.1 cm, and remains concerning for
malignancy. This is being followed by Urology.
3. Scattered vague hypodensities in the liver, measuring up to
cm in size and grossly stable in appearance. These are nonspecific
but may reflect cysts.
4. Relatively diffuse calcification along the abdominal aorta and
its branches. Relatively stable aneurysmal dilatation of the
infrarenal abdominal aorta to 3.5 cm in maximal AP dimension.
Recommend followup by ultrasound in 2 years. This recommendation
follows ACR consensus guidelines: White Paper of the ACR Incidental
Findings Committee II on Vascular Findings. [HOSPITAL] 6807;
[DATE].
5. Scattered diverticulosis along the proximal sigmoid colon,
without evidence of diverticulitis.
6. Suggestion of right-sided bladder diverticulum. Bladder otherwise
grossly unremarkable.
7. Minimal degenerative change along the lumbar spine.

## 2017-07-30 IMAGING — DX DG CHEST 2V
2 series · 2 of 2 positions shown · non-contrast
Comparison: CT of the abdomen and pelvis performed earlier today at
[DATE] a.m.

CLINICAL DATA: Acute onset of dry cough. Concern for pneumonia on
CT of the abdomen and pelvis. Initial encounter.

EXAM:
CHEST  2 VIEW

[chest pa]
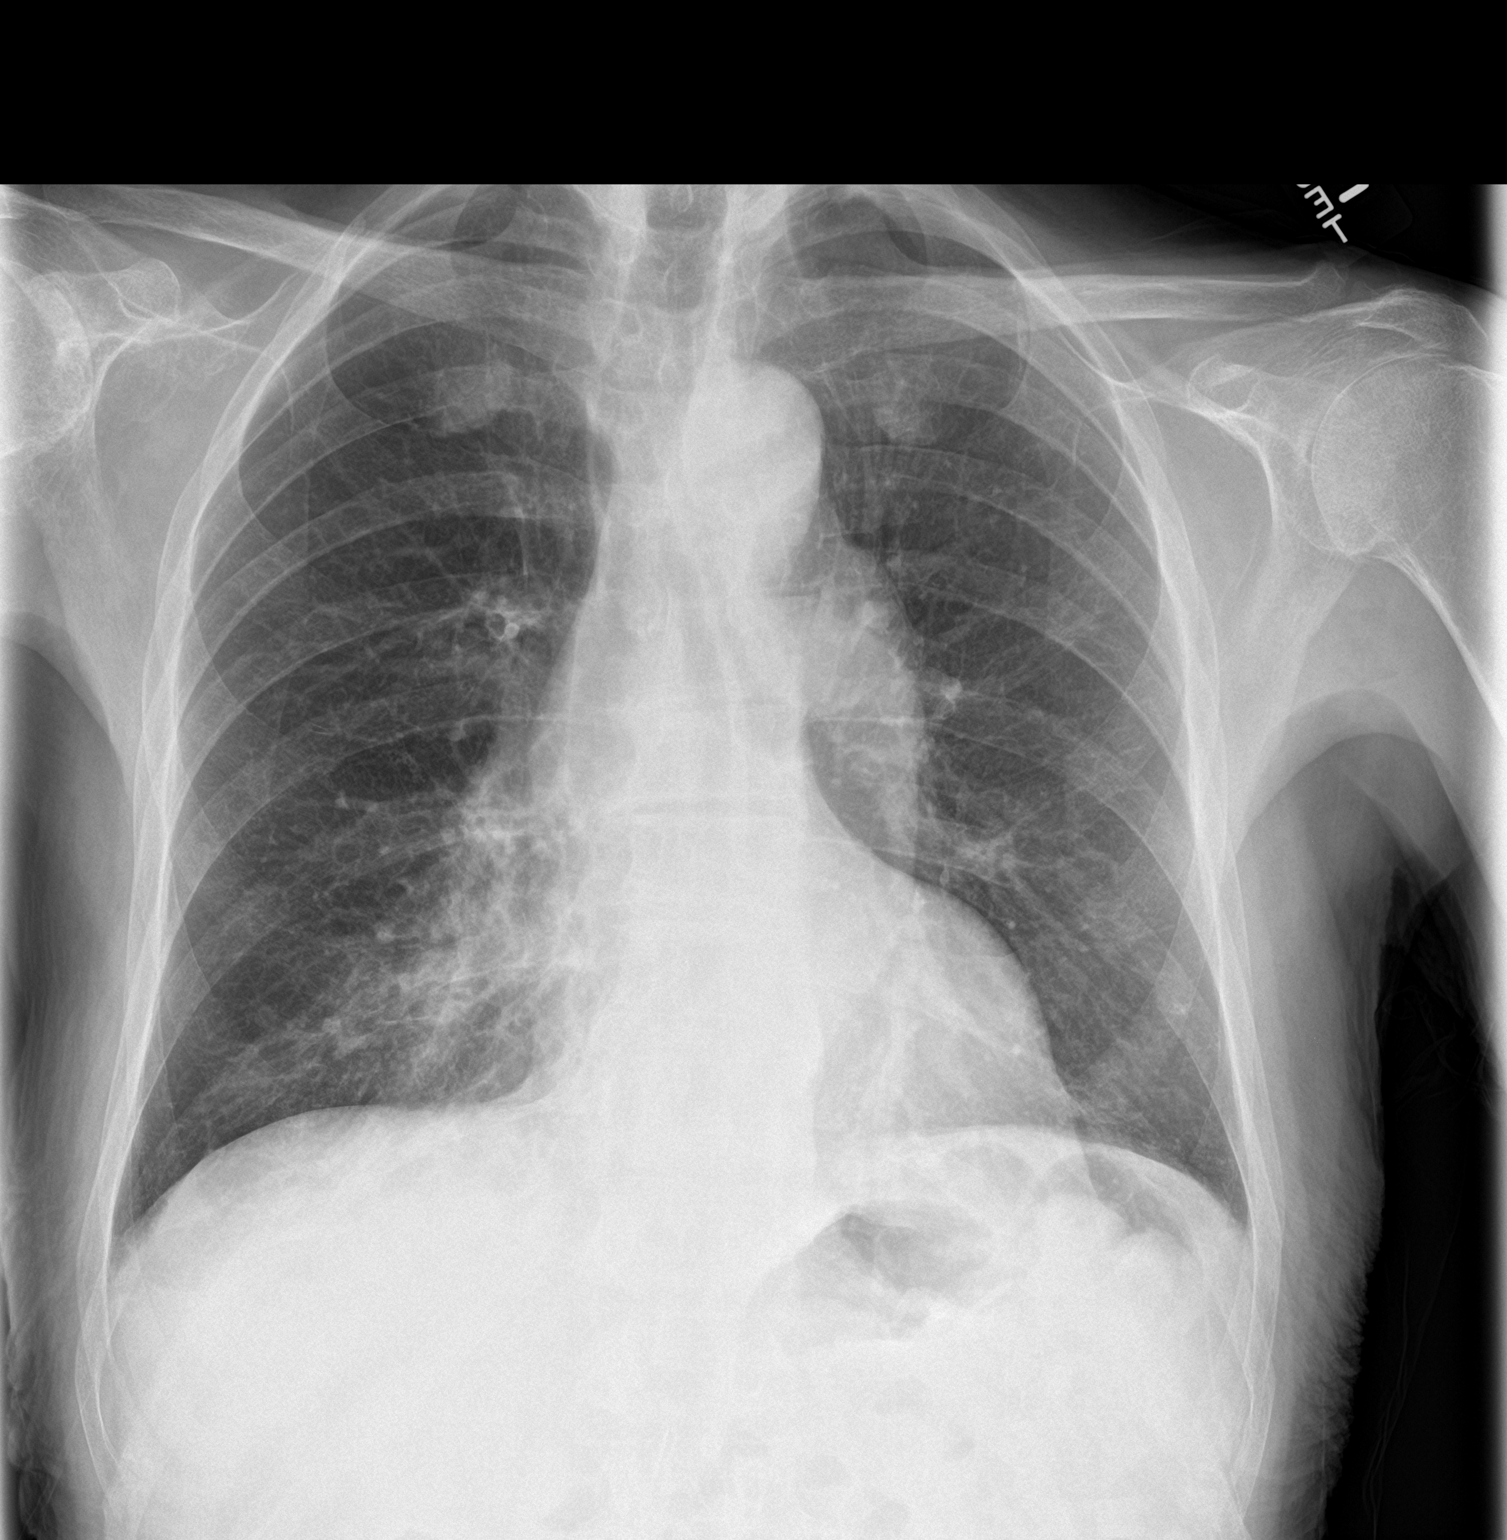

[chest lat]
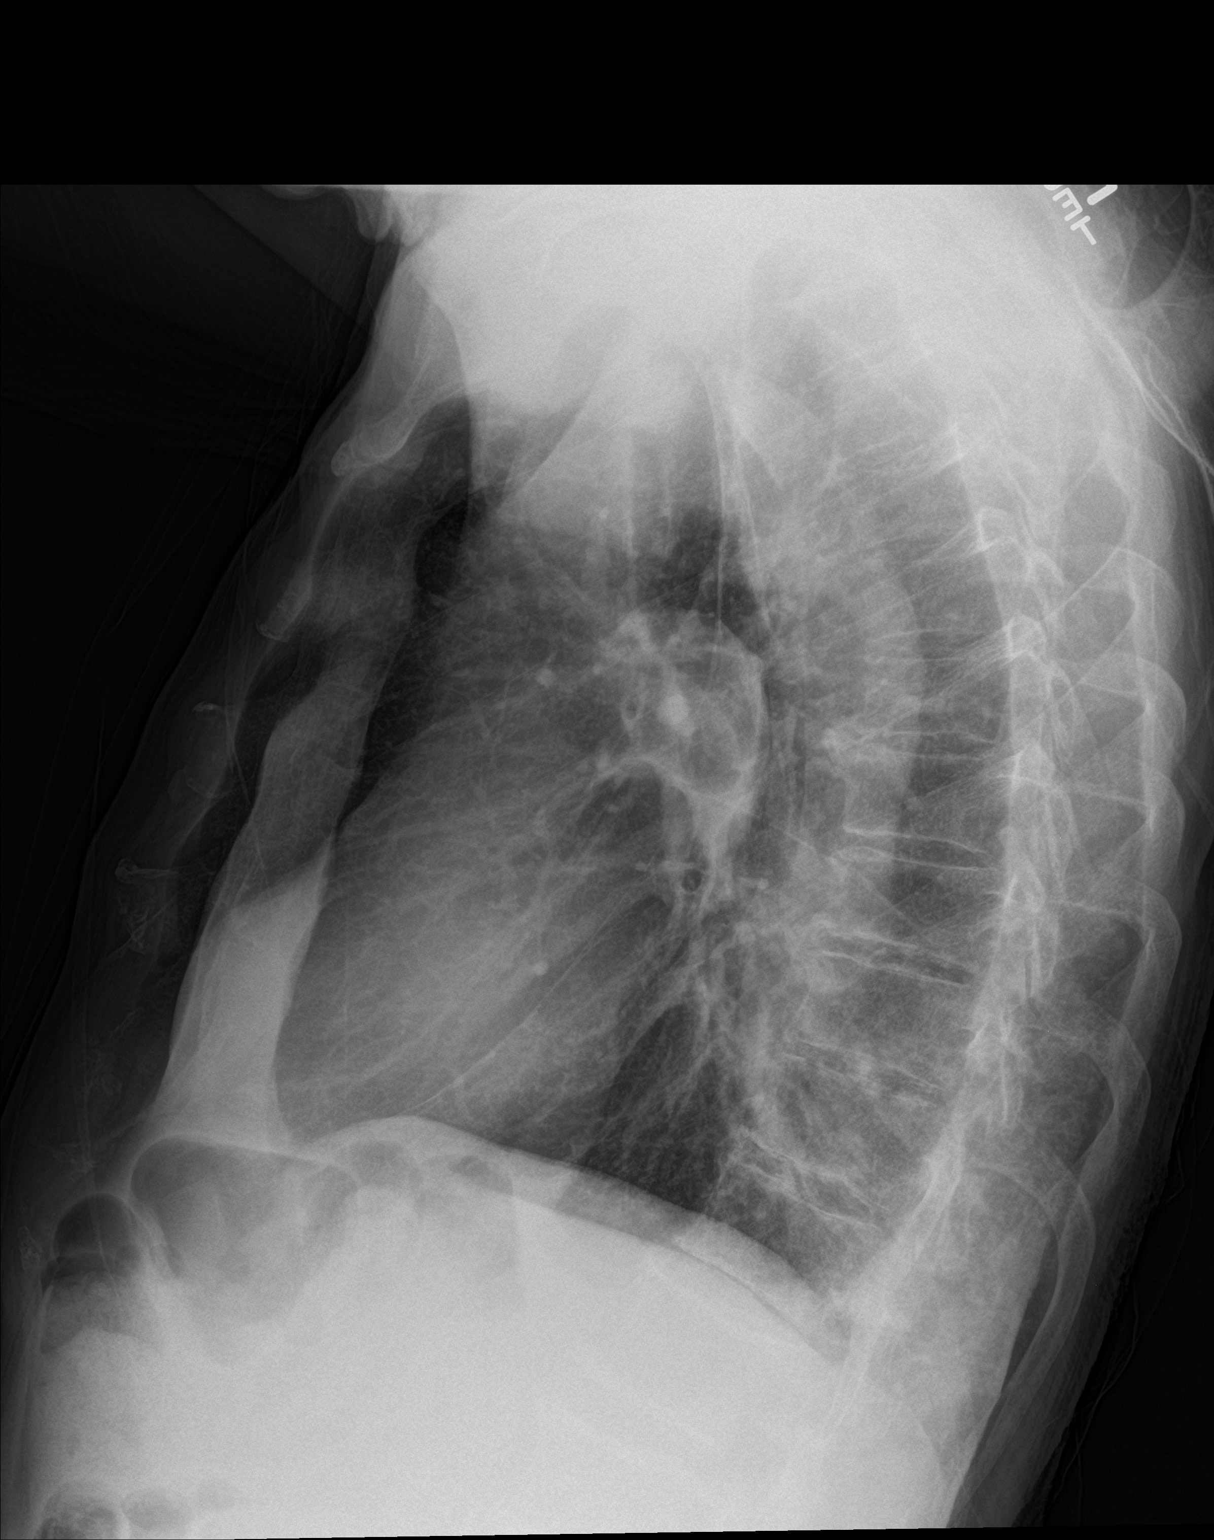

[2 of 2 positions shown; findings below may reference images not displayed]

FINDINGS: Right basilar airspace opacity is compatible with pneumonia, as
noted on recent CT. Peribronchial thickening is noted. No pleural
effusion or pneumothorax is seen.

The heart remains normal in size. No acute osseous abnormalities are
identified.
IMPRESSION: Right basilar pneumonia, as noted on recent CT. Peribronchial
thickening seen.

## 2017-12-29 DEATH — deceased
